# Patient Record
Sex: Male | Born: 1995
Health system: Southern US, Community
[De-identification: ages and names within clinical notes are randomized; demographics above are authoritative.]

## PROBLEM LIST (undated history)

## (undated) HISTORY — PX: ADENOIDECTOMY: SUR15

---
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
## 2018-05-03 ENCOUNTER — Telehealth: Payer: Self-pay | Admitting: Family Medicine

## 2018-05-03 MED ORDER — AZELASTINE HCL 0.1 % NA SOLN: NASAL | 5 refills | Status: DC

## 2018-05-03 NOTE — Telephone Encounter (Signed)
Pt's mom calling to see if a nasal spray can be called in that he can use with his allergy medication. 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   [facility] - [place],  - [address]

## 2018-05-03 NOTE — Telephone Encounter (Signed)
I would recommend allergy nasal spray I recommend using Astelin and typically does a good job without increasing the risk of viruses Flonase can also be used but it can cause increased risk of acquiring viruses including coronavirus Astelin 2 sprays each nostril twice daily, 1 spray bottle, 5 refills  Also recommend taking OTC allergy tablet store brand of Claritin or Allegra 1 daily

## 2018-05-03 NOTE — Telephone Encounter (Signed)
Medication sent in and pt mom is aware. Pt mom states that patient is taking Claritin daily

## 2019-06-04 DIAGNOSIS — D234 Other benign neoplasm of skin of scalp and neck: Secondary | ICD-10-CM | POA: Diagnosis not present

## 2019-06-04 DIAGNOSIS — B360 Pityriasis versicolor: Secondary | ICD-10-CM | POA: Diagnosis not present

## 2019-08-12 ENCOUNTER — Encounter (HOSPITAL_COMMUNITY): Payer: Self-pay | Admitting: Emergency Medicine

## 2019-08-12 ENCOUNTER — Other Ambulatory Visit: Payer: Self-pay

## 2019-08-12 ENCOUNTER — Emergency Department (HOSPITAL_COMMUNITY)
Admission: EM | Admit: 2019-08-12 | Discharge: 2019-08-12 | Disposition: A | Discharge status: Home / Self Care | Payer: BLUE CROSS/BLUE SHIELD | Attending: Emergency Medicine | Admitting: Emergency Medicine

## 2019-08-12 DIAGNOSIS — R21 Rash and other nonspecific skin eruption: Secondary | ICD-10-CM | POA: Insufficient documentation

## 2019-08-12 DIAGNOSIS — Z5321 Procedure and treatment not carried out due to patient leaving prior to being seen by health care provider: Secondary | ICD-10-CM | POA: Insufficient documentation

## 2019-08-12 NOTE — ED Triage Notes (Signed)
Pt c/o rash to bilateral upper arms and upper chest that started at 1800 tonight. Pt states he took 50mg  of Benadryl about 3 hours ago, rash looks better now according to pt.

## 2019-08-16 ENCOUNTER — Other Ambulatory Visit: Payer: Self-pay

## 2019-08-16 ENCOUNTER — Ambulatory Visit
Admission: EM | Admit: 2019-08-16 | Discharge: 2019-08-16 | Disposition: A | Discharge status: Home / Self Care | Payer: Federal, State, Local not specified - PPO | Attending: Emergency Medicine | Admitting: Emergency Medicine

## 2019-08-16 ENCOUNTER — Encounter: Payer: Self-pay | Admitting: Emergency Medicine

## 2019-08-16 DIAGNOSIS — L239 Allergic contact dermatitis, unspecified cause: Secondary | ICD-10-CM | POA: Diagnosis not present

## 2019-08-16 MED ORDER — METHYLPREDNISOLONE SODIUM SUCC 125 MG IJ SOLR
80.0000 mg | Freq: Once | INTRAMUSCULAR | Status: AC
  Administered 2019-08-16: 80 mg via INTRAMUSCULAR

## 2019-08-16 MED ORDER — PREDNISONE 10 MG (21) PO TBPK: ORAL_TABLET | ORAL | 0 refills | Status: DC

## 2019-08-16 NOTE — Discharge Instructions (Addendum)
Prescribed prednisone taper Continue to take Benadryl as prescribed Take as prescribed and to completion.   Moisturize skin daily Follow up with PCP if symptoms persists Return or go to the ER if you have any new or worsening symptoms

## 2019-08-16 NOTE — ED Triage Notes (Signed)
Pt has had an itchy rash on and off since Sunday to bilateral wrist and running up arms.  Also had the rash this morning on his feet but has went away now.

## 2019-08-16 NOTE — ED Provider Notes (Addendum)
[facility]   [id number] 08/16/19 Arrival Time: 1840   Chief Complaint  Patient presents with  . Rash    SUBJECTIVE: History from: patient.  Jeremy Webster is a 24 y.o. male who presented to the urgent care with a complaint of rash for the past 4 days.  He denies changes in soaps, detergents, or anyone with similar symptoms.  He localizes the rash to his hand and feet.  He describes it as red, itchy and spreading.  Has tried OTC Benadryl without relief.  Denies similar symptoms in the past.  Denies chills, fever, nausea, vomiting, diarrhea.  ROS: As per HPI.  All other pertinent ROS negative.      History reviewed. No pertinent past medical history. Past Surgical History:  Procedure Laterality Date  . ADENOIDECTOMY    . TONSILLECTOMY AND ADENOIDECTOMY  08/27/04   Allergies  Allergen Reactions  . Amoxil [Amoxicillin]   . Bactrim [Sulfamethoxazole-Trimethoprim]   . Cefzil [Cefprozil]    No current facility-administered medications on file prior to encounter.   Current Outpatient Medications on File Prior to Encounter  Medication Sig Dispense Refill  . azelastine (ASTELIN) 0.1 % nasal spray 2 sprays into each nostril twice daily 30 mL 5  . azithromycin (ZITHROMAX Z-PAK) 250 MG tablet Take 2 tablets (500 mg) on  Day 1,  followed by 1 tablet (250 mg) once daily on Days 2 through 5. 6 each 0  . ranitidine (ZANTAC) 300 MG tablet Take 1 tablet (300 mg total) by mouth at bedtime. 30 tablet 5   Social History   Socioeconomic History  . Marital status: Single    Spouse name: Not on file  . Number of children: Not on file  . Years of education: Not on file  . Highest education level: Not on file  Occupational History  . Not on file  Tobacco Use  . Smoking status: Never Smoker  . Smokeless tobacco: Current User    Types: Chew  Substance and Sexual Activity  . Alcohol use: Yes    Comment: occ  . Drug use: No  . Sexual activity: Not on file  Other Topics Concern  .  Not on file  Social History Narrative  . Not on file   Social Determinants of Health   Financial Resource Strain:   . Difficulty of Paying Living Expenses:   Food Insecurity:   . Worried About Programme researcher, broadcasting/film/video in the Last Year:   . Barista in the Last Year:   Transportation Needs:   . Freight forwarder (Medical):   Marland Kitchen Lack of Transportation (Non-Medical):   Physical Activity:   . Days of Exercise per Week:   . Minutes of Exercise per Session:   Stress:   . Feeling of Stress :   Social Connections:   . Frequency of Communication with Friends and Family:   . Frequency of Social Gatherings with Friends and Family:   . Attends Religious Services:   . Active Member of Clubs or Organizations:   . Attends Banker Meetings:   Marland Kitchen Marital Status:   Intimate Partner Violence:   . Fear of Current or Ex-Partner:   . Emotionally Abused:   Marland Kitchen Physically Abused:   . Sexually Abused:    No family history on file.  OBJECTIVE:  Vitals:   08/16/19 1849 08/16/19 1850  BP:  112/71  Pulse:  87  Resp:  18  Temp:  98.4 F (36.9 C)  TempSrc:  Oral  SpO2:  98%  Weight: (!) 240 lb (108.9 kg)   Height: 6\' 2"  (1.88 m)      Physical Exam Vitals and nursing note reviewed.  Constitutional:      General: He is not in acute distress.    Appearance: Normal appearance. He is normal weight. He is not ill-appearing, toxic-appearing or diaphoretic.  Cardiovascular:     Rate and Rhythm: Normal rate and regular rhythm.     Pulses: Normal pulses.     Heart sounds: Normal heart sounds. No murmur heard.  No friction rub. No gallop.   Pulmonary:     Effort: Pulmonary effort is normal. No respiratory distress.     Breath sounds: Normal breath sounds. No stridor. No wheezing, rhonchi or rales.  Chest:     Chest wall: No tenderness.  Skin:    General: Skin is warm.     Findings: Rash present. Rash is macular.  Neurological:     Mental Status: He is alert and oriented to  person, place, and time.     LABS:  No results found for this or any previous visit (from the past 24 hour(s)).   ASSESSMENT & PLAN:  1. Allergic dermatitis     Meds ordered this encounter  Medications  . methylPREDNISolone sodium succinate (SOLU-MEDROL) 125 mg/2 mL injection 80 mg  . predniSONE (STERAPRED UNI-PAK 21 TAB) 10 MG (21) TBPK tablet    Sig: Take 6 tabs by mouth daily  for 1 days, then 5 tabs for 1 days, then 4 tabs for 1 days, then 3 tabs for 1 days, 2 tabs for 1 days, then 1 tab by mouth daily for 1 days    Dispense:  21 tablet    Refill:  0   Discharge Instructions Prescribed prednisone taper Continue to take Benadryl as prescribed Take as prescribed and to completion.   Moisturize skin daily Follow up with PCP if symptoms persists Return or go to the ER if you have any new or worsening symptoms  Reviewed expectations re: course of current medical issues. Questions answered. Outlined signs and symptoms indicating need for more acute intervention. Patient verbalized understanding. After Visit Summary given.        Note: This document was prepared using Dragon voice recognition software and may include unintentional dictation errors.     , FNP 08/16/19 1918

## 2019-12-11 ENCOUNTER — Ambulatory Visit (INDEPENDENT_AMBULATORY_CARE_PROVIDER_SITE_OTHER): Payer: Federal, State, Local not specified - PPO | Admitting: Family Medicine

## 2019-12-11 ENCOUNTER — Encounter: Payer: Self-pay | Admitting: Family Medicine

## 2019-12-11 ENCOUNTER — Other Ambulatory Visit: Payer: Self-pay

## 2019-12-11 VITALS — BP 116/72 | HR 88 | Temp 97.8°F | Ht 74.0 in | Wt 243.4 lb

## 2019-12-11 DIAGNOSIS — R197 Diarrhea, unspecified: Secondary | ICD-10-CM

## 2019-12-11 MED ORDER — ONDANSETRON 4 MG PO TBDP: 4.0000 mg | ORAL_TABLET | Freq: Three times a day (TID) | ORAL | 0 refills | Status: DC | PRN

## 2019-12-11 MED ORDER — DICYCLOMINE HCL 10 MG PO CAPS: 10.0000 mg | ORAL_CAPSULE | Freq: Three times a day (TID) | ORAL | 0 refills | Status: DC

## 2019-12-11 NOTE — Progress Notes (Signed)
Patient ID: Jeremy Webster, male    DOB: 1995-03-01, 24 y.o.   MRN: [id number]   Chief Complaint  Patient presents with  . Abdominal Pain   Subjective:    HPI   Pt has been having stomach cramps and loose stool since Friday. No mucus or blood in stool. Has had some nausea. Has taken Pepto and anti diarrheal med.   3 days ago had upset stomach.  Having loose stool and diarrhea for past 3 days. Lasting till today and has dec today.  Has had 3 episodes earllier today. Not tried any pepto or immodium today. Took some meds otc over the weekend, helped slightly. Went 10 x on Friday-sun. No other sick contacts. No blood in stool.  No pian in abd.  Just having intermittent cramping. Small amt nausea. Eating/drinking.  Dec appetite since Friday. At nutrigrain bar and beans/hot dog- today.  Pt has been having stomach cramps and loose stool since Friday. No mucus or blood in stool. Has had some nausea. Has taken Pepto and anti diarrheal med.  Medical History Lottie has no past medical history on file.   Outpatient Encounter Medications as of 12/11/2019  Medication Sig  . azelastine (ASTELIN) 0.1 % nasal spray 2 sprays into each nostril twice daily  . dicyclomine (BENTYL) 10 MG capsule Take 1 capsule (10 mg total) by mouth 4 (four) times daily -  before meals and at bedtime.  . ondansetron (ZOFRAN-ODT) 4 MG disintegrating tablet Take 1 tablet (4 mg total) by mouth every 8 (eight) hours as needed for nausea or vomiting.  . [DISCONTINUED] azithromycin (ZITHROMAX Z-PAK) 250 MG tablet Take 2 tablets (500 mg) on  Day 1,  followed by 1 tablet (250 mg) once daily on Days 2 through 5.  . [DISCONTINUED] predniSONE (STERAPRED UNI-PAK 21 TAB) 10 MG (21) TBPK tablet Take 6 tabs by mouth daily  for 1 days, then 5 tabs for 1 days, then 4 tabs for 1 days, then 3 tabs for 1 days, 2 tabs for 1 days, then 1 tab by mouth daily for 1 days  . [DISCONTINUED] ranitidine (ZANTAC) 300 MG tablet Take 1 tablet (300  mg total) by mouth at bedtime.   No facility-administered encounter medications on file as of 12/11/2019.     Review of Systems  Constitutional: Negative for chills and fever.  HENT: Negative for congestion, rhinorrhea and sore throat.   Respiratory: Negative for cough, shortness of breath and wheezing.   Cardiovascular: Negative for chest pain and leg swelling.  Gastrointestinal: Positive for abdominal pain (cramping, intermittent) and diarrhea. Negative for nausea and vomiting.  Genitourinary: Negative for dysuria and frequency.  Skin: Negative for rash.  Neurological: Negative for dizziness, weakness and headaches.     Vitals BP 116/72   Pulse 88   Temp 97.8 F (36.6 C)   Ht 6\' 2"  (1.88 m)   Wt 243 lb 6.4 oz (110.4 kg)   SpO2 98%   BMI 31.25 kg/m   Objective:   Physical Exam Vitals and nursing note reviewed.  Constitutional:      General: He is not in acute distress.    Appearance: Normal appearance. He is not ill-appearing.  HENT:     Head: Normocephalic.     Nose: Nose normal. No congestion.     Mouth/Throat:     Mouth: Mucous membranes are moist.     Pharynx: No oropharyngeal exudate.  Eyes:     Extraocular Movements: Extraocular movements intact.  Conjunctiva/sclera: Conjunctivae normal.     Pupils: Pupils are equal, round, and reactive to light.  Cardiovascular:     Rate and Rhythm: Normal rate and regular rhythm.     Pulses: Normal pulses.     Heart sounds: Normal heart sounds. No murmur heard.   Pulmonary:     Effort: Pulmonary effort is normal.     Breath sounds: Normal breath sounds. No wheezing, rhonchi or rales.  Abdominal:     General: Abdomen is flat. Bowel sounds are normal. There is no distension.     Palpations: Abdomen is soft.     Tenderness: There is no abdominal tenderness. There is no guarding or rebound. Negative signs include Murphy's sign and McBurney's sign.  Musculoskeletal:        General: Normal range of motion.     Right  lower leg: No edema.     Left lower leg: No edema.  Skin:    General: Skin is warm and dry.     Findings: No rash.  Neurological:     General: No focal deficit present.     Mental Status: He is alert and oriented to person, place, and time.     Cranial Nerves: No cranial nerve deficit.  Psychiatric:        Mood and Affect: Mood normal.        Behavior: Behavior normal.        Thought Content: Thought content normal.        Judgment: Judgment normal.      Assessment and Plan   1. Diarrhea, unspecified type - ondansetron (ZOFRAN-ODT) 4 MG disintegrating tablet; Take 1 tablet (4 mg total) by mouth every 8 (eight) hours as needed for nausea or vomiting.  Dispense: 20 tablet; Refill: 0 - dicyclomine (BENTYL) 10 MG capsule; Take 1 capsule (10 mg total) by mouth 4 (four) times daily -  before meals and at bedtime.  Dispense: 20 capsule; Refill: 0    Likely viral acute gastroenteritis, improving.  Symptomatic tx. and increase fluids and eat brat diet.  prescribed bentyl or zofran prn for nausea and cramping. Can take immodium or pepto as needed. Cont to increase fluids. Pt to call in next 2-3 days if not improving or worsening with pain, fever, or blood in stool.  F/u prn.

## 2019-12-22 ENCOUNTER — Encounter: Payer: Self-pay | Admitting: Family Medicine

## 2020-05-30 DIAGNOSIS — R1312 Dysphagia, oropharyngeal phase: Secondary | ICD-10-CM | POA: Diagnosis not present

## 2020-05-31 ENCOUNTER — Other Ambulatory Visit (HOSPITAL_COMMUNITY): Payer: Self-pay | Admitting: Otolaryngology

## 2020-05-31 DIAGNOSIS — R1310 Dysphagia, unspecified: Secondary | ICD-10-CM

## 2020-06-11 ENCOUNTER — Ambulatory Visit (HOSPITAL_COMMUNITY)
Admission: RE | Admit: 2020-06-11 | Discharge: 2020-06-11 | Disposition: A | Discharge status: Home / Self Care | Payer: Federal, State, Local not specified - PPO | Source: Ambulatory Visit | Attending: Otolaryngology | Admitting: Otolaryngology

## 2020-06-11 DIAGNOSIS — R1310 Dysphagia, unspecified: Secondary | ICD-10-CM

## 2021-07-02 ENCOUNTER — Encounter: Payer: Self-pay | Admitting: Emergency Medicine

## 2021-07-02 ENCOUNTER — Ambulatory Visit
Admission: EM | Admit: 2021-07-02 | Discharge: 2021-07-02 | Disposition: A | Discharge status: Home / Self Care | Payer: Federal, State, Local not specified - PPO | Attending: Family Medicine | Admitting: Family Medicine

## 2021-07-02 ENCOUNTER — Other Ambulatory Visit: Payer: Self-pay

## 2021-07-02 DIAGNOSIS — R21 Rash and other nonspecific skin eruption: Secondary | ICD-10-CM | POA: Diagnosis not present

## 2021-07-02 MED ORDER — PREDNISONE 10 MG PO TABS: ORAL_TABLET | ORAL | 0 refills | Status: DC

## 2021-07-02 MED ORDER — TRIAMCINOLONE ACETONIDE 0.1 % EX CREA: 1.0000 "application " | TOPICAL_CREAM | Freq: Two times a day (BID) | CUTANEOUS | 0 refills | Status: DC

## 2021-07-02 NOTE — ED Provider Notes (Signed)
[facility]    CSN: [id number] Arrival date & time: 07/02/21  0806      History   Chief Complaint Chief Complaint  Patient presents with   Rash    HPI CHARES Webster is a 26 y.o. male.   Patient presenting today with an itchy rash that started on his upper extremities yesterday evening and is now across most of his body in patches.  He states it is itchy but not painful and denies any throat itching or swelling, chest tightness, difficulty breathing, nausea, vomiting.  No new foods, medications, outdoor exposures though has been working outside often lately.  Has not tried any medication so far for symptoms.    History reviewed. No pertinent past medical history.  Patient Active Problem List   Diagnosis Date Noted   Pharyngeal dysphagia 10/07/2016   Gastroesophageal reflux disease without esophagitis 10/07/2016   Tobacco chew use 10/07/2016    Past Surgical History:  Procedure Laterality Date   ADENOIDECTOMY     TONSILLECTOMY AND ADENOIDECTOMY  08/27/04       Home Medications    Prior to Admission medications   Medication Sig Start Date End Date Taking? Authorizing Provider  predniSONE (DELTASONE) 10 MG tablet Take 6 tabs daily x 2 days, 5 tabs daily x 2 days, 4 tabs daily x 2 days, etc 07/02/21  Yes Particia Nearing, PA-C  triamcinolone cream (KENALOG) 0.1 % Apply 1 application. topically 2 (two) times daily. 07/02/21  Yes Particia Nearing, PA-C  azelastine (ASTELIN) 0.1 % nasal spray 2 sprays into each nostril twice daily 05/03/18   Babs Sciara, MD  dicyclomine (BENTYL) 10 MG capsule Take 1 capsule (10 mg total) by mouth 4 (four) times daily -  before meals and at bedtime. 12/11/19   Ladona Ridgel, Malena M, DO  ondansetron (ZOFRAN-ODT) 4 MG disintegrating tablet Take 1 tablet (4 mg total) by mouth every 8 (eight) hours as needed for nausea or vomiting. 12/11/19   Annalee Genta, DO    Family History History reviewed. No pertinent family  history.  Social History Social History   Tobacco Use   Smoking status: Never   Smokeless tobacco: Current    Types: Chew  Substance Use Topics   Alcohol use: Yes    Comment: occ   Drug use: No     Allergies   Amoxil [amoxicillin], Bactrim [sulfamethoxazole-trimethoprim], and Cefzil [cefprozil]   Review of Systems Review of Systems Per HPI  Physical Exam Triage Vital Signs ED Triage Vitals  Enc Vitals Group     BP 07/02/21 0817 111/77     Pulse Rate 07/02/21 0817 86     Resp 07/02/21 0817 18     Temp 07/02/21 0817 97.8 F (36.6 C)     Temp Source 07/02/21 0817 Oral     SpO2 07/02/21 0817 98 %     Weight 07/02/21 0818 240 lb (108.9 kg)     Height 07/02/21 0818 6\' 2"  (1.88 m)     Head Circumference --      Peak Flow --      Pain Score 07/02/21 0818 0     Pain Loc --      Pain Edu? --      Excl. in GC? --    No data found.  Updated Vital Signs BP 111/77 (BP Location: Right Arm)   Pulse 86   Temp 97.8 F (36.6 C) (Oral)   Resp 18   Ht 6\' 2"  (1.88  m)   Wt 240 lb (108.9 kg)   SpO2 98%   BMI 30.81 kg/m   Visual Acuity Right Eye Distance:   Left Eye Distance:   Bilateral Distance:    Right Eye Near:   Left Eye Near:    Bilateral Near:     Physical Exam Vitals and nursing note reviewed.  Constitutional:      Appearance: Normal appearance.  HENT:     Head: Atraumatic.     Mouth/Throat:     Mouth: Mucous membranes are moist.     Pharynx: Oropharynx is clear.  Eyes:     Extraocular Movements: Extraocular movements intact.     Conjunctiva/sclera: Conjunctivae normal.  Cardiovascular:     Rate and Rhythm: Normal rate and regular rhythm.  Pulmonary:     Effort: Pulmonary effort is normal.     Breath sounds: Normal breath sounds. No wheezing or rales.  Musculoskeletal:        General: Normal range of motion.     Cervical back: Normal range of motion and neck supple.  Skin:    General: Skin is warm and dry.     Findings: Rash present.      Comments: Sporadic erythematous maculopapular rash in areas across body sparing the face  Neurological:     General: No focal deficit present.     Mental Status: He is oriented to person, place, and time.  Psychiatric:        Mood and Affect: Mood normal.        Thought Content: Thought content normal.        Judgment: Judgment normal.      UC Treatments / Results  Labs (all labs ordered are listed, but only abnormal results are displayed) Labs Reviewed - No data to display  EKG   Radiology No results found.  Procedures Procedures (including critical care time)  Medications Ordered in UC Medications - No data to display  Initial Impression / Assessment and Plan / UC Course  I have reviewed the triage vital signs and the nursing notes.  Pertinent labs & imaging results that were available during my care of the patient were reviewed by me and considered in my medical decision making (see chart for details).     Suspect poison ivy dermatitis, treat with extended prednisone taper, triamcinolone cream, antihistamines.  Return for worsening symptoms.  Final Clinical Impressions(s) / UC Diagnoses   Final diagnoses:  Rash   Discharge Instructions   None    ED Prescriptions     Medication Sig Dispense Auth. Provider   predniSONE (DELTASONE) 10 MG tablet Take 6 tabs daily x 2 days, 5 tabs daily x 2 days, 4 tabs daily x 2 days, etc 42 tablet Particia Nearing, PA-C   triamcinolone cream (KENALOG) 0.1 % Apply 1 application. topically 2 (two) times daily. 60 g Particia Nearing, [place]      PDMP not reviewed this encounter.   Roosvelt Maser [place], [place] 07/02/21 [phone]

## 2021-07-02 NOTE — ED Triage Notes (Signed)
Pt reports generalized rash that started last night. Pt reports started on bilateral hands with swelling and reports this morning it has "spread all over my body." Pt reports generalized itching sensation. Airway patent. Denies any new self care products.

## 2021-07-03 ENCOUNTER — Telehealth: Payer: Self-pay | Admitting: Family Medicine

## 2021-07-03 NOTE — Telephone Encounter (Signed)
Pt contacted and verbalized understanding.  

## 2021-07-03 NOTE — Telephone Encounter (Signed)
Pt sister calling and upset due to being directed to Urgent Care/ED due to no available slots. Pt went to urgent care yesterday for a rash. Sister states that the urgent care doctor was unsure of what it was. Pt has bumps from head to feet and has spread. Please advise. Thank you.  [facility]

## 2021-12-20 ENCOUNTER — Ambulatory Visit
Admission: EM | Admit: 2021-12-20 | Discharge: 2021-12-20 | Disposition: A | Discharge status: Home / Self Care | Payer: Federal, State, Local not specified - PPO | Attending: Nurse Practitioner | Admitting: Nurse Practitioner

## 2021-12-20 DIAGNOSIS — Z1152 Encounter for screening for COVID-19: Secondary | ICD-10-CM | POA: Insufficient documentation

## 2021-12-20 DIAGNOSIS — B349 Viral infection, unspecified: Secondary | ICD-10-CM | POA: Diagnosis not present

## 2021-12-20 LAB — RESP PANEL BY RT-PCR (FLU A&B, COVID) ARPGX2
Influenza A by PCR: NEGATIVE
Influenza B by PCR: NEGATIVE
SARS Coronavirus 2 by RT PCR: NEGATIVE

## 2021-12-20 NOTE — ED Triage Notes (Signed)
Pt reports cough, nasal congestion, body aches and chills x 3 days. Mucinex gives no relief.

## 2021-12-20 NOTE — Discharge Instructions (Addendum)
COVID/flu test is pending.  If your pending test results are positive, you will be contacted.  You will also be able to access your pending test results using your MyChart account.  As discussed, if the COVID test is positive, you are a candidate to receive molnupiravir as an antiviral therapy. Increase fluids and allow for plenty of rest. Recommend continuing ibuprofen or Tylenol as needed for pain, fever, or generalized discomfort. Recommend remaining in isolation until you have received your COVID results. Please be advised a viral infection can last anywhere from 7 to 14 days.  If your symptoms suddenly worsen to include high fever, cough that causes you to be short of breath or have difficulty breathing, or other concerns, please follow-up in this clinic or with your primary care physician. Follow-up as needed.

## 2021-12-20 NOTE — ED Provider Notes (Signed)
[facility]    CSN: [id number] Arrival date & time: 12/20/21  0902      History   Chief Complaint Chief Complaint  Patient presents with   Generalized Body Aches   Cough    HPI Jeremy Webster is a 26 y.o. male.   The history is provided by the patient.   Patient presents with a 2 to 3-day history of bodyaches, headaches, chills, and generalized fatigue.  Denies fever, sore throat, nasal congestion, runny nose, cough, abdominal pain, nausea, vomiting, or diarrhea.  Patient states he has been taking over-the-counter Mucinex cough and cold for his symptoms.  He states that he has also been taking ibuprofen for his headache.  He denies any known sick contacts.  States he has not been vaccinated for COVID.   History reviewed. No pertinent past medical history.  Patient Active Problem List   Diagnosis Date Noted   Pharyngeal dysphagia 10/07/2016   Gastroesophageal reflux disease without esophagitis 10/07/2016   Tobacco chew use 10/07/2016    Past Surgical History:  Procedure Laterality Date   ADENOIDECTOMY     TONSILLECTOMY AND ADENOIDECTOMY  08/27/04       Home Medications    Prior to Admission medications   Medication Sig Start Date End Date Taking? Authorizing Provider  dextromethorphan-guaiFENesin (MUCINEX DM) 30-600 MG 12hr tablet Take 1 tablet by mouth 2 (two) times daily.   Yes [provider]  azelastine (ASTELIN) 0.1 % nasal spray 2 sprays into each nostril twice daily 05/03/18   Luking, Jonna Coup, MD  dicyclomine (BENTYL) 10 MG capsule Take 1 capsule (10 mg total) by mouth 4 (four) times daily -  before meals and at bedtime. 12/11/19   Ladona Ridgel, Malena M, DO  ondansetron (ZOFRAN-ODT) 4 MG disintegrating tablet Take 1 tablet (4 mg total) by mouth every 8 (eight) hours as needed for nausea or vomiting. 12/11/19   Laroy Apple M, DO  predniSONE (DELTASONE) 10 MG tablet Take 6 tabs daily x 2 days, 5 tabs daily x 2 days, 4 tabs daily x 2 days, etc  07/02/21   Particia Nearing, PA-C  triamcinolone cream (KENALOG) 0.1 % Apply 1 application. topically 2 (two) times daily. 07/02/21   Particia Nearing, PA-C    Family History History reviewed. No pertinent family history.  Social History Social History   Tobacco Use   Smoking status: Never   Smokeless tobacco: Current    Types: Chew  Vaping Use   Vaping Use: Never used  Substance Use Topics   Alcohol use: Yes    Comment: occ   Drug use: No     Allergies   Amoxil [amoxicillin], Bactrim [sulfamethoxazole-trimethoprim], and Cefzil [cefprozil]   Review of Systems Review of Systems Per HPI  Physical Exam Triage Vital Signs ED Triage Vitals  Enc Vitals Group     BP 12/20/21 1025 116/68     Pulse Rate 12/20/21 1025 (!) 103     Resp 12/20/21 1025 16     Temp 12/20/21 1025 99 F (37.2 C)     Temp Source 12/20/21 1025 Oral     SpO2 12/20/21 1025 98 %     Weight --      Height --      Head Circumference --      Peak Flow --      Pain Score 12/20/21 1023 0     Pain Loc --      Pain Edu? --  Excl. in GC? --    No data found.  Updated Vital Signs BP 116/68 (BP Location: Right Arm)   Pulse (!) 103   Temp 99 F (37.2 C) (Oral)   Resp 16   SpO2 98%   Visual Acuity Right Eye Distance:   Left Eye Distance:   Bilateral Distance:    Right Eye Near:   Left Eye Near:    Bilateral Near:     Physical Exam Vitals and nursing note reviewed.  Constitutional:      General: He is not in acute distress.    Appearance: Normal appearance.  HENT:     Head: Normocephalic.     Right Ear: Tympanic membrane, ear canal and external ear normal.     Left Ear: Tympanic membrane, ear canal and external ear normal.     Nose: Congestion present.     Right Turbinates: Enlarged and swollen.     Left Turbinates: Enlarged and swollen.     Right Sinus: No maxillary sinus tenderness or frontal sinus tenderness.     Left Sinus: No maxillary sinus tenderness or frontal sinus  tenderness.     Mouth/Throat:     Lips: Pink.     Mouth: Mucous membranes are moist.     Pharynx: Uvula midline. Pharyngeal swelling and posterior oropharyngeal erythema present. No oropharyngeal exudate or uvula swelling.     Tonsils: 1+ on the right. 1+ on the left.  Eyes:     Extraocular Movements: Extraocular movements intact.     Conjunctiva/sclera: Conjunctivae normal.     Pupils: Pupils are equal, round, and reactive to light.  Cardiovascular:     Rate and Rhythm: Normal rate and regular rhythm.     Pulses: Normal pulses.     Heart sounds: Normal heart sounds.  Pulmonary:     Effort: Pulmonary effort is normal. No respiratory distress.     Breath sounds: Normal breath sounds. No stridor. No wheezing, rhonchi or rales.  Abdominal:     General: Bowel sounds are normal.     Palpations: Abdomen is soft.     Tenderness: There is no abdominal tenderness.  Musculoskeletal:     Cervical back: Normal range of motion.  Lymphadenopathy:     Cervical: No cervical adenopathy.  Skin:    General: Skin is warm and dry.  Neurological:     General: No focal deficit present.     Mental Status: He is alert and oriented to person, place, and time.  Psychiatric:        Mood and Affect: Mood normal.        Behavior: Behavior normal.      UC Treatments / Results  Labs (all labs ordered are listed, but only abnormal results are displayed) Labs Reviewed  RESP PANEL BY RT-PCR (FLU A&B, COVID) [id number]    EKG   Radiology No results found.  Procedures Procedures (including critical care time)  Medications Ordered in UC Medications - No data to display  Initial Impression / Assessment and Plan / UC Course  I have reviewed the triage vital signs and the nursing notes.  Pertinent labs & imaging results that were available during my care of the patient were reviewed by me and considered in my medical decision making (see chart for details).  Patient presents with bodyaches, headache,  and fatigue that been present for the past 3 days.  On exam, his vital signs are stable, although he is slightly tachycardic, he is well-appearing, and is in  no acute distress.  Symptoms are consistent with a viral illness at this time.  COVID/flu test is pending.  Patient would like to receive antiviral therapy if the COVID test is positive.  He is a candidate to receive molnupiravir.  Discussed remaining in isolation until his results are received.  If patient's COVID test is positive, advised him we will be able to extend his work note for an additional 2 days.  He will need to follow-up with his PCP if he requires more time out of work.  Discussed viral etiology with the patient and how long symptoms may last.  Supportive care recommendations were provided to the patient.  Patient verbalizes understanding.  All questions were answered.  Patient is stable for discharge. Final Clinical Impressions(s) / UC Diagnoses   Final diagnoses:  Viral illness     Discharge Instructions      COVID/flu test is pending.  If your pending test results are positive, you will be contacted.  You will also be able to access your pending test results using your MyChart account.  As discussed, if the COVID test is positive, you are a candidate to receive molnupiravir as an antiviral therapy. Increase fluids and allow for plenty of rest. Recommend continuing ibuprofen or Tylenol as needed for pain, fever, or generalized discomfort. Recommend remaining in isolation until you have received your COVID results. Please be advised a viral infection can last anywhere from 7 to 14 days.  If your symptoms suddenly worsen to include high fever, cough that causes you to be short of breath or have difficulty breathing, or other concerns, please follow-up in this clinic or with your primary care physician. Follow-up as needed.     ED Prescriptions   None    PDMP not reviewed this encounter.   Abran Cantor,  NP 12/20/21 1053

## 2023-10-06 ENCOUNTER — Emergency Department (HOSPITAL_COMMUNITY)
Admission: EM | Admit: 2023-10-06 | Discharge: 2023-10-06 | Disposition: A | Discharge status: Home / Self Care | Attending: Emergency Medicine | Admitting: Emergency Medicine

## 2023-10-06 ENCOUNTER — Other Ambulatory Visit: Payer: Self-pay

## 2023-10-06 DIAGNOSIS — R202 Paresthesia of skin: Secondary | ICD-10-CM | POA: Insufficient documentation

## 2023-10-06 DIAGNOSIS — R42 Dizziness and giddiness: Secondary | ICD-10-CM | POA: Diagnosis not present

## 2023-10-06 DIAGNOSIS — R200 Anesthesia of skin: Secondary | ICD-10-CM | POA: Diagnosis not present

## 2023-10-06 LAB — CBC WITH DIFFERENTIAL/PLATELET
Abs Immature Granulocytes: 0.03 K/uL (ref 0.00–0.07)
Basophils Absolute: 0 K/uL (ref 0.0–0.1)
Basophils Relative: 0 %
Eosinophils Absolute: 0.1 K/uL (ref 0.0–0.5)
Eosinophils Relative: 2 %
HCT: 52.2 % — ABNORMAL HIGH (ref 39.0–52.0)
Hemoglobin: 16.9 g/dL (ref 13.0–17.0)
Immature Granulocytes: 1 %
Lymphocytes Relative: 25 %
Lymphs Abs: 1.5 K/uL (ref 0.7–4.0)
MCH: 27.8 pg (ref 26.0–34.0)
MCHC: 32.4 g/dL (ref 30.0–36.0)
MCV: 85.9 fL (ref 80.0–100.0)
Monocytes Absolute: 0.5 K/uL (ref 0.1–1.0)
Monocytes Relative: 8 %
Neutro Abs: 3.8 K/uL (ref 1.7–7.7)
Neutrophils Relative %: 64 %
Platelets: 354 K/uL (ref 150–400)
RBC: 6.08 MIL/uL — ABNORMAL HIGH (ref 4.22–5.81)
RDW: 12.5 % (ref 11.5–15.5)
WBC: 6 K/uL (ref 4.0–10.5)
nRBC: 0 % (ref 0.0–0.2)

## 2023-10-06 LAB — COMPREHENSIVE METABOLIC PANEL WITH GFR
ALT: 25 U/L (ref 0–44)
AST: 21 U/L (ref 15–41)
Albumin: 4.4 g/dL (ref 3.5–5.0)
Alkaline Phosphatase: 63 U/L (ref 38–126)
Anion gap: 11 (ref 5–15)
BUN: 10 mg/dL (ref 6–20)
CO2: 22 mmol/L (ref 22–32)
Calcium: 9.7 mg/dL (ref 8.9–10.3)
Chloride: 104 mmol/L (ref 98–111)
Creatinine, Ser: 0.95 mg/dL (ref 0.61–1.24)
GFR, Estimated: >60 mL/min (ref >60)
Glucose, Bld: 117 mg/dL — ABNORMAL HIGH (ref 70–99)
Potassium: 4.4 mmol/L (ref 3.5–5.1)
Sodium: 137 mmol/L (ref 135–145)
Total Bilirubin: 1.1 mg/dL (ref 0.0–1.2)
Total Protein: 7.7 g/dL (ref 6.5–8.1)

## 2023-10-06 LAB — I-STAT CHEM 8, ED
BUN: 12 mg/dL (ref 6–20)
Calcium, Ion: 1.18 mmol/L (ref 1.15–1.40)
Chloride: 103 mmol/L (ref 98–111)
Creatinine, Ser: 1 mg/dL (ref 0.61–1.24)
Glucose, Bld: 124 mg/dL — ABNORMAL HIGH (ref 70–99)
HCT: 52 % (ref 39.0–52.0)
Hemoglobin: 17.7 g/dL — ABNORMAL HIGH (ref 13.0–17.0)
Potassium: 4.5 mmol/L (ref 3.5–5.1)
Sodium: 139 mmol/L (ref 135–145)
TCO2: 24 mmol/L (ref 22–32)

## 2023-10-06 LAB — MAGNESIUM: Magnesium: 1.9 mg/dL (ref 1.7–2.4)

## 2023-10-06 MED ORDER — SODIUM CHLORIDE 0.9 % IV BOLUS
1000.0000 mL | Freq: Once | INTRAVENOUS | Status: AC
  Administered 2023-10-06: 1000 mL via INTRAVENOUS

## 2023-10-06 NOTE — Discharge Instructions (Signed)
 Return to the ER if you develop new or worsening numbness, weakness, headache, speech changes, or any other new/concerning symptoms.

## 2023-10-06 NOTE — ED Triage Notes (Addendum)
 Pt. Stated, I was on the way to work and my right arm felt tingling and went to left arm and to shoulders . When I stand up things were going around. Ive been sitting in my boss' office. Pt  was alert and oriented x 4 , No arm drift, = grips.

## 2023-10-06 NOTE — ED Provider Triage Note (Signed)
 Emergency Medicine Provider Triage Evaluation Note  Jeremy Webster , a 28 y.o. male  was evaluated in triage.  Pt complains of dizziness paresthesias in his arms.  Review of Systems  Positive: Dizziness and paresthesias in his arms Negative: No headache, chest pain shortness of breath weakness speech changes vision changes hearing loss.  Positive for lightheaded feeling tingling in his hands up his arms but getting better  Physical Exam  BP 136/83 (BP Location: Right Arm)   Pulse 84   Temp 97.6 F (36.4 C)   Resp 16   Ht 6' 2 (1.88 m)   Wt 111.1 kg   SpO2 100%   BMI 31.46 kg/m  Gen:   Awake, no distress   Resp:  Normal effort  MSK:   Moves extremities without difficulty  Other: Cranial nerves intact strength and sensation intact speech intact visual fields intact no nystagmus  Medical Decision Making  Medically screening exam initiated at 8:32 AM.  Appropriate orders placed.  Jeremy Webster was informed that the remainder of the evaluation will be completed by another provider, this initial triage assessment does not replace that evaluation, and the importance of remaining in the ED until their evaluation is complete.     Ruthe Cornet, DO 10/06/23 [phone]

## 2023-10-06 NOTE — ED Provider Notes (Signed)
 [place] EMERGENCY DEPARTMENT AT [facility] Provider Note   CSN: [id number] Arrival date & time: 10/06/23  9184     Patient presents with: Dizziness and Numbness   Jeremy Webster is a 28 y.o. male.   HPI 28 year old male with no significant past medical history presents with a chief complaint of numbness/tingling.  Symptoms started around 6:30 AM at work.  When he first woke up he felt fine.  He had some transient heartburn and abdominal pain yesterday but none today.  He started having right arm tingling in his entire arm that then moved to his left arm.  On the car ride here he had tingling in both legs.  Tingling in his arms is improved and now there is a little bit in his posterior neck.  However there is no neck pain, headache, vision changes, facial symptoms or chest pain.  Feels improved but does feel lightheaded, particularly when standing this morning.  Did not eat breakfast this morning but normally does not at this time of day.  Prior to Admission medications   Medication Sig Start Date End Date Taking? Authorizing Provider  azelastine  (ASTELIN ) 0.1 % nasal spray 2 sprays into each nostril twice daily 05/03/18   Luking, Glendia LABOR, MD  dextromethorphan-guaiFENesin (MUCINEX DM) 30-600 MG 12hr tablet Take 1 tablet by mouth 2 (two) times daily.    [provider]  dicyclomine  (BENTYL ) 10 MG capsule Take 1 capsule (10 mg total) by mouth 4 (four) times daily -  before meals and at bedtime. 12/11/19   Waddell, Malena M, DO  ondansetron  (ZOFRAN -ODT) 4 MG disintegrating tablet Take 1 tablet (4 mg total) by mouth every 8 (eight) hours as needed for nausea or vomiting. 12/11/19   Waddell Catholic M, DO  predniSONE  (DELTASONE ) 10 MG tablet Take 6 tabs daily x 2 days, 5 tabs daily x 2 days, 4 tabs daily x 2 days, etc 07/02/21   Stuart Vernell Norris, PA-C  triamcinolone  cream (KENALOG ) 0.1 % Apply 1 application. topically 2 (two) times daily. 07/02/21   Stuart Vernell Norris, PA-C     Allergies: Amoxil [amoxicillin], Bactrim [sulfamethoxazole-trimethoprim], and Cefzil [cefprozil]    Review of Systems  Respiratory:  Negative for shortness of breath.   Cardiovascular:  Negative for chest pain.  Gastrointestinal:  Negative for diarrhea and vomiting.  Musculoskeletal:  Negative for neck pain.  Neurological:  Positive for light-headedness and numbness. Negative for weakness and headaches.    Updated Vital Signs BP 136/83 (BP Location: Right Arm)   Pulse 84   Temp 97.6 F (36.4 C)   Resp 16   Ht 6' 2 (1.88 m)   Wt 111.1 kg   SpO2 100%   BMI 31.46 kg/m   Physical Exam Vitals and nursing note reviewed.  Constitutional:      General: He is not in acute distress.    Appearance: He is well-developed. He is not ill-appearing or diaphoretic.  HENT:     Head: Normocephalic and atraumatic.  Eyes:     Extraocular Movements: Extraocular movements intact.  Cardiovascular:     Rate and Rhythm: Normal rate and regular rhythm.     Pulses:          Radial pulses are 2+ on the right side and 2+ on the left side.     Heart sounds: Normal heart sounds.  Pulmonary:     Effort: Pulmonary effort is normal.     Breath sounds: Normal breath sounds.  Abdominal:  Palpations: Abdomen is soft.     Tenderness: There is no abdominal tenderness.  Skin:    General: Skin is warm and dry.  Neurological:     Mental Status: He is alert.     Comments: CN 3-12 grossly intact. 5/5 strength in all 4 extremities. Grossly normal sensation. Normal finger to nose.      (all labs ordered are listed, but only abnormal results are displayed) Labs Reviewed  CBC WITH DIFFERENTIAL/PLATELET - Abnormal; Notable for the following components:      Result Value   RBC 6.08 (*)    HCT 52.2 (*)    All other components within normal limits  COMPREHENSIVE METABOLIC PANEL WITH GFR - Abnormal; Notable for the following components:   Glucose, Bld 117 (*)    All other components within normal  limits  I-STAT CHEM 8, ED - Abnormal; Notable for the following components:   Glucose, Bld 124 (*)    Hemoglobin 17.7 (*)    All other components within normal limits  MAGNESIUM    EKG: EKG Interpretation Date/Time:  Thursday October 06 2023 08:27:44 EDT Ventricular Rate:  79 PR Interval:  170 QRS Duration:  94 QT Interval:  348 QTC Calculation: 399 R Axis:   55  Text Interpretation: Normal sinus rhythm with sinus arrhythmia Normal ECG No previous ECGs available Confirmed by Freddi Hamilton [phone]) on 10/06/2023 9:37:17 AM  Radiology: No results found.   Procedures   Medications Ordered in the ED  sodium chloride  0.9 % bolus 1,000 mL (has no administration in time range)                                    Medical Decision Making Amount and/or Complexity of Data Reviewed Labs:     Details: Unremarkable electrolytes ECG/medicine tests: ordered and independent interpretation performed.    Details: No ischemia   Patient presents with tingling.  Neurovascularly intact.  Exam is unremarkable including normal sensation.  I think these are paresthesias and given the diffuse nature think something such as stroke or spinal cord emergency is quite unlikely.  I do not think imaging is needed.  He has no chest pain.  Transient abdominal pain/GERD yesterday but has a benign abdominal exam and no complaints today.  Given some fluids and allowed to eat but with a negative workup I think he can follow-up with PCP and consider outpatient neuro follow-up if not better.  Patient states he drinks some alcohol but not significantly, perhaps can consider B12/folate testing if paresthesias continue as an outpatient.  Given return precautions.     Final diagnoses:  Paresthesia    ED Discharge Orders     None          Freddi Hamilton, MD 10/06/23 1304

## 2023-10-06 NOTE — ED Triage Notes (Signed)
 Pt c.o sudden onset of bilateral arm numbness and feeling lightheaded about 1 hour ago while driving to work. Denies any pain

## 2023-10-08 DIAGNOSIS — R079 Chest pain, unspecified: Secondary | ICD-10-CM | POA: Diagnosis not present

## 2023-10-08 DIAGNOSIS — R202 Paresthesia of skin: Secondary | ICD-10-CM | POA: Diagnosis not present

## 2023-10-08 DIAGNOSIS — R200 Anesthesia of skin: Secondary | ICD-10-CM | POA: Diagnosis not present

## 2023-10-08 DIAGNOSIS — R0789 Other chest pain: Secondary | ICD-10-CM | POA: Diagnosis not present

## 2023-10-10 ENCOUNTER — Ambulatory Visit (INDEPENDENT_AMBULATORY_CARE_PROVIDER_SITE_OTHER): Admitting: Nurse Practitioner

## 2023-10-10 ENCOUNTER — Encounter: Payer: Self-pay | Admitting: Nurse Practitioner

## 2023-10-10 ENCOUNTER — Ambulatory Visit (HOSPITAL_COMMUNITY)
Admission: RE | Admit: 2023-10-10 | Discharge: 2023-10-10 | Disposition: A | Discharge status: Home / Self Care | Source: Ambulatory Visit | Attending: Nurse Practitioner | Admitting: Nurse Practitioner

## 2023-10-10 VITALS — BP 110/72 | HR 83 | Ht 74.0 in | Wt 254.0 lb

## 2023-10-10 DIAGNOSIS — R202 Paresthesia of skin: Secondary | ICD-10-CM | POA: Insufficient documentation

## 2023-10-10 DIAGNOSIS — M545 Low back pain, unspecified: Secondary | ICD-10-CM | POA: Insufficient documentation

## 2023-10-10 DIAGNOSIS — R200 Anesthesia of skin: Secondary | ICD-10-CM | POA: Diagnosis not present

## 2023-10-10 HISTORY — DX: Low back pain, unspecified: M54.50

## 2023-10-10 HISTORY — DX: Paresthesia of skin: R20.2

## 2023-10-10 NOTE — Progress Notes (Signed)
 Subjective:    Patient ID: Ester GORMAN Bring, male    DOB: [birth year], 28 y.o.   MRN: [id number]  HPI Discussed the use of AI scribe software for clinical note transcription with the patient, who gave verbal consent to proceed.  History of Present Illness DONEVIN SAINSBURY is a 28 year old male who presents with numbness and tingling in the right arm and heaviness in the legs.  Symptoms began last Thursday at approximately 6:40 AM while driving to work. Initially, his right arm went numb, which he attributed to sleeping on it wrong. By the time he arrived at work, the numbness had spread to his left arm. Shortly after, he experienced a tingling sensation in his neck and became lightheaded. Three visits for his symptoms: 9/11 and 9/13.  He underwent blood work at multiple facilities, which returned normal results, including glucose, potassium, kidney function, magnesium, and CBC. Inflammatory markers were negative, and EKGs were normal. Despite these findings, he continues to experience symptoms.  The numbness in his right arm persists, described as a tingling sensation affecting the whole arm and hand, but he denies any weakness. He is able to perform his normal job functions. Sleeping on the couch seemed to alleviate the tingling, but it returned after sleeping in his bed.  He reports heaviness in both legs, which became more pronounced on 11/16/23, making it difficult to lift them. No falls or injuries to his back or neck. He has experienced back pain in the mornings, requiring him to sit on the edge of the bed before standing due to sharp pain in the lower back. Denies bladder or bowel issues.   He mentions a sore feeling in the middle of his chest, which is tender to touch, much improved. No visual changes, difficulty speaking or swallowing, or severe headaches. He has noticed some shallow breathing when walking and reports high anxiety levels last Thursday, which have since decreased.  No history of  back or neck injuries, automobile accidents, or falls at work. He has not had a specialist workup for his back or neck pain.   Review of Systems  Respiratory:  Negative for cough, chest tightness, shortness of breath and wheezing.   Cardiovascular:  Positive for chest pain.       Localized chest discomfort to the lower middle chest with palpation.   Musculoskeletal:  Positive for back pain.  Neurological:  Negative for syncope, facial asymmetry, speech difficulty, weakness and headaches.      10/10/2023   10:18 AM  Depression screen PHQ 2/9  Decreased Interest 0  Down, Depressed, Hopeless 0  PHQ - 2 Score 0  Altered sleeping 0  Tired, decreased energy 0  Change in appetite 1  Feeling bad or failure about yourself  0  Trouble concentrating 0  Moving slowly or fidgety/restless 0  Suicidal thoughts 0  PHQ-9 Score 1  Difficult doing work/chores Not difficult at all      10/10/2023   10:19 AM  GAD 7 : Generalized Anxiety Score  Nervous, Anxious, on Edge 1  Control/stop worrying 0  Worry too much - different things 0  Trouble relaxing 0  Restless 0  Easily annoyed or irritable 0  Afraid - awful might happen 0  Total GAD 7 Score 1  Anxiety Difficulty Not difficult at all    Social History   Tobacco Use   Smoking status: Never   Smokeless tobacco: Current    Types: Chew  Vaping Use   Vaping  status: Never Used  Substance Use Topics   Alcohol use: Yes    Comment: occ   Drug use: No        Objective:   Physical Exam Vitals and nursing note reviewed.  Constitutional:      General: He is not in acute distress. Cardiovascular:     Rate and Rhythm: Normal rate and regular rhythm.     Heart sounds: Normal heart sounds.  Pulmonary:     Effort: Pulmonary effort is normal.     Breath sounds: Normal breath sounds.  Musculoskeletal:        General: Normal range of motion.     Cervical back: Normal range of motion.     Comments: Normal ROM of the back without pain.  Normal ROM of the shoulders without pain. Right arm: sensation grossly intact. Normal hand strength bilaterally. SLR negative bilaterally. Tight tender muscles noted along the cervical and lateral neck.   Skin:    General: Skin is warm and dry.  Neurological:     Mental Status: He is alert and oriented to person, place, and time.     Motor: No weakness.     Gait: Gait normal.     Deep Tendon Reflexes: Reflexes normal.  Psychiatric:        Mood and Affect: Mood normal.        Behavior: Behavior normal.        Thought Content: Thought content normal.        Judgment: Judgment normal.    Today's Vitals   10/10/23 1016  BP: 110/72  Pulse: 83  Weight: 254 lb (115.2 kg)  Height: 6' 2 (1.88 m)   Body mass index is 32.61 kg/m.        Assessment & Plan:  1. Paresthesia of arm (Primary) Intermittent numbness and tingling in right arm and leg heaviness likely due to nerve compression or musculoskeletal strain. No weakness or incontinence. No stroke or cardiac issues. - Order cervical and lumbar spine x-rays. - Recommend massage therapy for lateral neck and back. - Advise on proper sleeping posture for head alignment. - DG Lumbar Spine Complete; Future - DG Cervical Spine Complete; Future  2. Acute bilateral low back pain without sciatica  - DG Lumbar Spine Complete; Future - DG Cervical Spine Complete; Future   Intermittent chest soreness with normal cardiac workup indicates musculoskeletal origin. Possibly related to anxiety or stress. - Reassured regarding non-cardiac origin of chest pain. Chest xray, labs and EKGs all normal.    Return if symptoms worsen or fail to improve.

## 2023-10-12 ENCOUNTER — Encounter: Payer: Self-pay | Admitting: Nurse Practitioner

## 2023-10-15 ENCOUNTER — Ambulatory Visit: Payer: Self-pay | Admitting: Nurse Practitioner

## 2023-10-17 NOTE — Telephone Encounter (Signed)
 Nurses I recommend a follow-up same-day visit with myself or El[facility]ia this week may need to have additional test potentially MRI arranged

## 2023-10-17 NOTE — Telephone Encounter (Signed)
 Spoke with patient and scheduled him for an appt tomorrow at 3:30 pm

## 2023-10-18 ENCOUNTER — Encounter: Payer: Self-pay | Admitting: Family Medicine

## 2023-10-18 ENCOUNTER — Ambulatory Visit: Admitting: Family Medicine

## 2023-10-18 VITALS — BP 113/68 | HR 63 | Temp 98.1°F | Ht 74.0 in | Wt 253.0 lb

## 2023-10-18 DIAGNOSIS — R202 Paresthesia of skin: Secondary | ICD-10-CM

## 2023-10-18 NOTE — Progress Notes (Unsigned)
   Subjective:    Patient ID: Jeremy Webster, male    DOB: Jul 29, 1995, 28 y.o.   MRN: [id number]  HPI Numbness in legs and hands, MRI orders needed patient with significant numbness issues this started off about 2 weeks ago with a sensation of numbness across his back shoulders down into both arms lasted most of the day into the next day he was concerned about it he went to the ER they pretty much ran blood test EKG told him he was not having a heart attack and told him he could go a couple days later he went to [facility] ER with similar concerns of numbness in the arms as well as numbness in both legs and feeling like his legs were heavy they did blood work General Exam and let him go and told him to follow-up with his doctor He followed up with [facility] recently having numbness and his arms hands as well as intermittent numbness in the legs x-rays of the neck and lumbar spine were normal numbness seem to go away but over the past few days he has had a reoccurrence of numbness in the legs along with the sensation that his legs are heavy and difficult to walk with he denies overt weakness denies unilateral numbness weakness no severe headaches no nausea vomiting no fevers no recent injury no family history of these type of issues   Review of Systems     Objective:   Physical Exam Lungs are clear respiratory rate normal heart is regular pulse normal reflexes diminished reflexes in both legs but the strength is good subjective tingling numbness sensation in both legs and also in his arms mainly from the forearm down to the hands He also relates a tingling sensation across his upper back Neurologically there is no focal findings of weakness No ataxia EOMI normal reflexes diminished in the lower legs       Assessment & Plan:  A sensation of lower leg numbness paresthesias as well as legs feeling heavy and somewhat weak Also some intermittent numbness in both arms I am concerned about the possibility  of underlying neurologic issue possibly MS The likelihood of a issue going all the way down the spine is low but it is possible that this could be within the brain and cervical cord region given it is affecting both the upper back arms as well as legs He has gone to the ER twice he has been seen here twice symptoms are not resolving plus also perceived heaviness in his legs with some difficulty ambulating when he has these spells Given all of this it is reasonable to push forward with an MRI to rule out the possibility of MS or some sort of cord lesion in the cervical spine  We will also help set him up for neurology depending on the results of the MRI We will have the staff order MRI of the brain and cervical spine to rule out MS or other demyelinating disease

## 2023-10-19 ENCOUNTER — Ambulatory Visit: Payer: Self-pay | Admitting: Family Medicine

## 2023-10-19 LAB — VITAMIN B12: Vitamin B-12: 448 pg/mL (ref 232–1245)

## 2023-10-20 ENCOUNTER — Encounter: Payer: Self-pay | Admitting: Family Medicine

## 2023-10-20 ENCOUNTER — Other Ambulatory Visit: Payer: Self-pay

## 2023-10-20 DIAGNOSIS — R202 Paresthesia of skin: Secondary | ICD-10-CM

## 2023-10-25 ENCOUNTER — Ambulatory Visit: Payer: Self-pay

## 2023-10-25 ENCOUNTER — Encounter: Payer: Self-pay | Admitting: Nurse Practitioner

## 2023-10-25 ENCOUNTER — Ambulatory Visit (INDEPENDENT_AMBULATORY_CARE_PROVIDER_SITE_OTHER): Admitting: Nurse Practitioner

## 2023-10-25 ENCOUNTER — Telehealth: Payer: Self-pay

## 2023-10-25 VITALS — BP 113/80 | HR 112 | Temp 97.9°F | Ht 74.0 in | Wt 249.4 lb

## 2023-10-25 DIAGNOSIS — R051 Acute cough: Secondary | ICD-10-CM | POA: Insufficient documentation

## 2023-10-25 DIAGNOSIS — R000 Tachycardia, unspecified: Secondary | ICD-10-CM

## 2023-10-25 DIAGNOSIS — R0981 Nasal congestion: Secondary | ICD-10-CM | POA: Diagnosis not present

## 2023-10-25 DIAGNOSIS — J309 Allergic rhinitis, unspecified: Secondary | ICD-10-CM | POA: Diagnosis not present

## 2023-10-25 DIAGNOSIS — R52 Pain, unspecified: Secondary | ICD-10-CM | POA: Insufficient documentation

## 2023-10-25 HISTORY — DX: Allergic rhinitis, unspecified: J30.9

## 2023-10-25 HISTORY — DX: Tachycardia, unspecified: R00.0

## 2023-10-25 LAB — VERITOR FLU A/B WAIVED
Influenza A: NEGATIVE
Influenza B: NEGATIVE

## 2023-10-25 MED ORDER — FLUTICASONE PROPIONATE 50 MCG/ACT NA SUSP: 2.0000 | Freq: Every day | NASAL | 6 refills | Status: AC

## 2023-10-25 MED ORDER — GUAIFENESIN 400 MG PO TABS: 400.0000 mg | ORAL_TABLET | Freq: Four times a day (QID) | ORAL | 0 refills | Status: DC | PRN

## 2023-10-25 MED ORDER — CETIRIZINE HCL 10 MG PO TABS: 10.0000 mg | ORAL_TABLET | Freq: Every day | ORAL | 11 refills | Status: AC

## 2023-10-25 NOTE — Telephone Encounter (Signed)
 Copied from CRM #[id number]. Topic: Clinical - Lab/Test Results >> Oct 25, 2023  9:34 AM Nathanel BROCKS wrote: Reason for CRM: pt is wanting to have location of MRI change to [facility] in [place] fax [phone]  [employer] agent called and stated that the pt requested this and she was calling on his behalf.

## 2023-10-25 NOTE — Progress Notes (Signed)
 Subjective:  Patient ID: Jeremy Webster, male    DOB: Feb 23, 1995, 28 y.o.   MRN: [id number]  Patient Care Team: Alphonsa Glendia LABOR, MD as PCP - General (Family Medicine)   Chief Complaint:  Hypertension (Symptoms started today 130/92 hr 132), elevated heart rate, and Nasal Congestion   HPI: Jeremy Webster is a 27 y.o. male presenting on 10/25/2023 for Hypertension (Symptoms started today 130/92 hr 132), elevated heart rate, and Nasal Congestion   Discussed the use of AI scribe software for clinical note transcription with the patient, who gave verbal consent to proceed.  History of Present Illness Jeremy Webster is a 28 year old male who presents with congestion, body aches, and elevated blood pressure.  He has been experiencing congestion and body aches since yesterday. He also reports a sensation of his pulse in his neck and recorded elevated blood pressure readings of 132/90 mmHg with a heart rate of 132 bpm this morning while at work, despite being sedentary. Last EKG 10/07/2023 normal sinus rhythm  He has taken DayQuil and NyQuil for his congestion but is unsure if they contain the DM component. He has not been around anyone known to be sick, although some coworkers have had congestion.  He feels flushed today but denies having a fever. He also mentions a sore throat and numbness in his hands and arms for the past couple of weeks, with the numbness occasionally moving to his legs. He has visited the emergency room three times for these symptoms, and all blood work has returned normal. An MRI is planned, pending orders from his primary care physician.  No significant changes in appetite but notes not feeling very hungry today and only consuming a bar this morning.      Relevant past medical, surgical, family, and social history reviewed and updated as indicated.  Allergies and medications reviewed and updated. Data reviewed: Chart in Epic.   History reviewed. No pertinent past medical  history.  Past Surgical History:  Procedure Laterality Date   ADENOIDECTOMY     TONSILLECTOMY AND ADENOIDECTOMY  08/27/04    Social History   Socioeconomic History   Marital status: Single    Spouse name: Not on file   Number of children: Not on file   Years of education: Not on file   Highest education level: Not on file  Occupational History   Not on file  Tobacco Use   Smoking status: Never   Smokeless tobacco: Current    Types: Chew  Vaping Use   Vaping status: Never Used  Substance and Sexual Activity   Alcohol use: Yes    Comment: occ   Drug use: No   Sexual activity: Yes    Birth control/protection: None  Other Topics Concern   Not on file  Social History Narrative   Not on file   Social Drivers of Health   Financial Resource Strain: Not on file  Food Insecurity: Not on file  Transportation Needs: Not on file  Physical Activity: Not on file  Stress: Not on file  Social Connections: Not on file  Intimate Partner Violence: Not At Risk (10/08/2023)   Received from [facility]   HITS    Over the last 12 months how often did your partner physically hurt you?: Never    Over the last 12 months how often did your partner insult you or talk down to you?: Never    Over the last 12 months how often did  your partner threaten you with physical harm?: Never    Over the last 12 months how often did your partner scream or curse at you?: Never    Outpatient Encounter Medications as of 10/25/2023  Medication Sig   azelastine  (ASTELIN ) 0.1 % nasal spray 2 sprays into each nostril twice daily (Patient not taking: Reported on 10/25/2023)   dicyclomine  (BENTYL ) 10 MG capsule Take 1 capsule (10 mg total) by mouth 4 (four) times daily -  before meals and at bedtime. (Patient not taking: Reported on 10/25/2023)   No facility-administered encounter medications on file as of 10/25/2023.    Allergies  Allergen Reactions   Amoxil [Amoxicillin]    Bactrim  [Sulfamethoxazole-Trimethoprim]    Cefzil [Cefprozil]     Pertinent ROS per HPI, otherwise unremarkable      Objective:  BP 113/80   Pulse (!) 112   Temp 97.9 F (36.6 C) (Temporal)   Ht 6' 2 (1.88 m)   Wt 249 lb 6.4 oz (113.1 kg)   SpO2 98%   BMI 32.02 kg/m    Wt Readings from Last 3 Encounters:  10/25/23 249 lb 6.4 oz (113.1 kg)  10/18/23 253 lb (114.8 kg)  10/10/23 254 lb (115.2 kg)    Physical Exam Vitals and nursing note reviewed.  HENT:     Nose: Congestion present. No septal deviation.     Right Turbinates: Swollen.     Left Turbinates: Swollen.     Mouth/Throat:     Lips: Pink.     Mouth: Mucous membranes are moist.     Pharynx: Postnasal drip present. No posterior oropharyngeal erythema.     Tonsils: No tonsillar exudate.  Eyes:     General: No scleral icterus.    Extraocular Movements: Extraocular movements intact.     Conjunctiva/sclera: Conjunctivae normal.     Pupils: Pupils are equal, round, and reactive to light.  Neck:     Vascular: No carotid bruit.  Cardiovascular:     Heart sounds: Normal heart sounds.  Pulmonary:     Effort: Pulmonary effort is normal.     Breath sounds: Normal breath sounds.  Musculoskeletal:        General: Normal range of motion.     Cervical back: Normal range of motion and neck supple. No rigidity or tenderness.     Right lower leg: No edema.     Left lower leg: No edema.  Lymphadenopathy:     Cervical: No cervical adenopathy.  Skin:    General: Skin is warm and dry.     Findings: No rash.  Neurological:     Mental Status: He is oriented to person, place, and time.  Psychiatric:        Mood and Affect: Mood normal.        Behavior: Behavior normal.        Thought Content: Thought content normal.        Judgment: Judgment normal.    Physical Exam HEENT: Nasal mucosa erythematous, suggestive of allergies. Throat normal.     Results for orders placed or performed in visit on 10/18/23  Vitamin B12    Collection Time: 10/18/23  4:21 PM  Result Value Ref Range   Vitamin B-12 448 232 - 1,245 pg/mL      Flu A and  B negative Pertinent labs & imaging results that were available during my care of the patient were reviewed by me and considered in my medical decision making.  Assessment & Plan:  Jeremy Webster was seen today for hypertension, elevated heart rate and nasal congestion.  Diagnoses and all orders for this visit:  Body aches -     Veritor Flu A/B Waived  Nasal congestion -     Veritor Flu A/B Waived  Allergic rhinitis, unspecified seasonality, unspecified trigger  Acute cough     Assessment and Plan Assessment & Plan Acute allergic rhinitis with postnasal drip Acute allergic rhinitis with significant postnasal drip, erythematous turbinates indicating allergic component. - Swab for influenza to rule out viral infection.  Acute upper respiratory symptoms Acute upper respiratory symptoms with congestion, sore throat, and body aches. No fever, no significant throat abnormalities. - Swab for influenza to rule out viral infection.  Intermittent hypertension and tachycardia, likely medication-induced Intermittent hypertension and tachycardia likely due to DayQuil and NyQuil use. Blood pressure 132/90 mmHg, heart rate 132 bpm. - Advise avoiding medications with 'DM' as he can increase blood pressure.  Paresthesia of hands and arms, under evaluation Paresthesia of hands and arms, previously noted to have included legs. Previous evaluations normal, awaiting MRI. - Follow up with PCP to expedite MRI orders.  Tachycardia: BMP to r/u electrolytes imbalance    Continue all other maintenance medications.  Follow up plan: No follow-ups on file.   Continue healthy lifestyle choices, including diet (rich in fruits, vegetables, and lean proteins, and low in salt and simple carbohydrates) and exercise (at least 30 minutes of moderate physical activity daily).  Educational handout  given for    Clinical References  Cough, Adult A cough helps to clear your throat and lungs. It may be a sign of an illness or another condition. A short-term (acute) cough may last 2-3 weeks. A long-term (chronic) cough may last 8 or more weeks. Many things can cause a cough. They include: Illnesses such as: An infection in your throat or lungs. Asthma or other heart or lung problems. Gastroesophageal reflux. This is when acid comes back up from your stomach. Breathing in things that bother (irritate) your lungs. Allergies. Postnasal drip. This is when mucus runs down the back of your throat. Smoking. Some medicines. Follow these instructions at home: Medicines Take over-the-counter and prescription medicines only as told by your doctor. Talk with your doctor before you take cough medicine (cough suppressants). Eating and drinking Do not drink alcohol. Do not drink caffeine. Drink enough fluid to keep your pee (urine) pale yellow. Lifestyle Stay away from cigarette smoke. Do not smoke or use any products that contain nicotine or tobacco. If you need help quitting, ask your doctor. Stay away from things that make you cough. These may include perfume, candles, cleaning products, or campfire smoke. General instructions  Watch for any changes to your cough. Tell your doctor about them. Always cover your mouth when you cough. If the air is dry in your home, use a cool mist vaporizer or humidifier. If your cough is worse at night, try using extra pillows to raise your head up higher while you sleep. Rest as needed. Contact a doctor if: You have new symptoms. Your symptoms get worse. You cough up pus. You have a fever that does not go away. Your cough does not get better after 2-3 weeks. Cough medicine does not help, and you are not sleeping well. You have pain that gets worse or is not helped with medicine. You are losing weight and do not know why. You have night sweats. Get  help right away if: You cough up blood. You have trouble breathing.  Your heart is beating very fast. These symptoms may be an emergency. Get help right away. Call 911. Do not wait to see if the symptoms will go away. Do not drive yourself to the hospital. This information is not intended to replace advice given to you by your health care provider. Make sure you discuss any questions you have with your health care provider. Document Revised: 09/11/2021 Document Reviewed: 09/11/2021 Elsevier Patient Education  2024 Elsevier Inc. Allergic Rhinitis, Adult  Allergic rhinitis is a reaction to allergens. Allergens are things that can cause an allergic reaction. This condition affects the lining inside the nose (mucous membrane). There are two types of allergic rhinitis: Seasonal. This type is also called hay fever. It happens only during some times of the year. Perennial. This type can happen at any time of the year. This condition cannot be spread from person to person (is not contagious). It can be mild, bad, or very bad. It can develop at any age and may be outgrown. What are the causes? Pollen from grasses, trees, and weeds. Other causes can be: Dust mites. Smoke. Mold. Car fumes. The pee (urine), spit, or dander of pets. Dander is dead skin cells from a pet. What increases the risk? You are more likely to develop this condition if: You have allergies in your family. You have problems like allergies in your family. You may have: Swelling of parts of your eyes and eyelids. Asthma. This affects how you breathe. Long-term redness and swelling on your skin. Food allergies. What are the signs or symptoms? The main symptom of this condition is a runny or stuffy nose (nasal congestion). Other symptoms may include: Sneezing or coughing. Itching and tearing of your eyes. Mucus that drips down the back of your throat (postnasal drip). This may cause a sore throat. Trouble sleeping. Feeling  tired. Headache. How is this treated? There is no cure for this condition. You should avoid things that you are allergic to. Treatment can help to relieve symptoms. This may include: Medicines that block allergy symptoms, such as anti-inflammatories or antihistamines. These may be given as a shot, nasal spray, or pill. Avoiding things you are allergic to. Medicines that give you some of what you are allergic to over time. This is called immunotherapy. It is done if other treatments do not help. You may get: Shots. Medicine under your tongue. Stronger medicines, if other treatments do not help. Follow these instructions at home: Avoiding allergens Find out what things you are allergic to and avoid them. To do this, try these things: If you get allergies any time of year: Replace carpet with wood, tile, or vinyl flooring. Carpet can trap pet dander and dust. Do not smoke. Do not allow smoking in your home. Change your heating and air conditioning filters at least once a month. If you get allergies only some times of the year: Keep windows closed when you can. Plan things to do outside when pollen counts are lowest. Check pollen counts before you plan things to do outside. When you come indoors, change your clothes and shower before you sit on furniture or bedding. If you are allergic to a pet: Keep the pet out of your bedroom. Vacuum, sweep, and dust often. General instructions Take over-the-counter and prescription medicines only as told by your doctor. Drink enough fluid to keep your pee pale yellow. Where to find more information American Academy of Allergy, Asthma & Immunology: aaaai.org Contact a doctor if: You have a fever. You  get a cough that does not go away. You make high-pitched whistling sounds when you breathe, most often when you breathe out (wheeze). Your symptoms slow you down. Your symptoms stop you from doing your normal things each day. Get help right away if: You  are short of breath. This symptom may be an emergency. Get help right away. Call 911. Do not wait to see if the symptom will go away. Do not drive yourself to the hospital. This information is not intended to replace advice given to you by your health care provider. Make sure you discuss any questions you have with your health care provider. Document Revised: 09/21/2021 Document Reviewed: 09/21/2021 Elsevier Patient Education  2024 Elsevier Inc. Sinus Tachycardia  Sinus tachycardia is a fast heartbeat. In sinus tachycardia, the heart beats more than 100 times a minute. Sinus tachycardia starts in the part of the heart called the sinoatrial (SA) node. Sinus tachycardia may be harmless, or it may be a sign of a serious condition. What are the causes? This condition may be caused by: Exercise or exertion. A fever. Pain. Loss of body fluids (dehydration). Severe bleeding (hemorrhage). Anxiety and stress. Certain substances, including: Alcohol. Caffeine. Tobacco and nicotine products. Cold medicines. Illegal drugs. Medical conditions including: Heart disease. An infection. An overactive thyroid (hyperthyroidism). A lack of red blood cells (anemia). What are the signs or symptoms? Symptoms of this condition include: A feeling that the heart is beating fast or unevenly (palpitations). Suddenly noticing your heartbeat (cardiac awareness). Lightheadedness. Tiredness (fatigue). Shortness of breath. Chest pain. Nausea. Fainting. How is this diagnosed? This condition is diagnosed with: A physical exam. Tests or monitoring, such as: Blood tests. An electrocardiogram (ECG). This test measures the electrical activity of the heart. Ambulatory cardiac monitor. This records your heartbeats for 24 hours or more. You may be referred to a heart specialist (cardiologist). How is this treated? Treatment for this condition depends on the cause. Treatment may involve: Treating the underlying  condition. Taking new medicines or changing your current medicines as told by your health care provider. Making changes to your diet or lifestyle. Follow these instructions at home: Lifestyle  Do not use any products that contain nicotine or tobacco. These products include cigarettes, chewing tobacco, and vaping devices, such as e-cigarettes. If you need help quitting, ask your health care provider. Do not use illegal drugs, such as cocaine. Learn relaxation methods to help you when you get stressed or anxious. These include deep breathing. Avoid caffeine or other stimulants, including herbal stimulants that are found in energy drinks. Alcohol use  Do not drink alcohol if: Your health care provider tells you not to drink. You are pregnant, may be pregnant, or are planning to become pregnant. If you drink alcohol: Limit how much you have to: 0-1 drink a day for women. 0-2 drinks a day for men. Know how much alcohol is in your drink. In the [place] one drink equals one 12 oz bottle of beer (355 mL), one 5 oz glass of wine (148 mL), or one 1 oz glass of hard liquor (44 mL). General instructions Drink enough fluids to keep your urine pale yellow. Take over-the-counter and prescription medicines only as told by your health care provider. Ask your health care provider about taking vitamins, herbs, and supplements. Contact a health care provider if: You have vomiting or diarrhea that does not go away. You have a fever. You have weakness or dizziness. You feel faint. Get help right away if: You have  pain in your chest, upper arms, jaw, or neck. You have palpitations that do not go away. Summary In sinus tachycardia, the heart beats more than 100 times a minute. Sinus tachycardia may be harmless, or it may be a sign of a serious condition. Treatment for this condition depends on the cause or the underlying condition. Get help right away if you have pain in your chest, upper arms, jaw, or  neck. This information is not intended to replace advice given to you by your health care provider. Make sure you discuss any questions you have with your health care provider. Document Revised: 05/12/2021 Document Reviewed: 05/12/2021 Elsevier Patient Education  2024 Elsevier Inc. Paresthesia Paresthesia is a burning or prickling feeling. This feeling can happen in any part of the body. It often happens in the hands, arms, legs, or feet. Usually, it is not painful. In most cases, the feeling goes away in a short time and is not a sign of a serious problem. If you have paresthesia that lasts a long time, you need to see your doctor. Follow these instructions at home: Nutrition Eat a healthy diet. This includes: Eating foods that are high in fiber. These include beans, whole grains, and fresh fruits and vegetables. Limiting foods that are high in fat and sugar. These include fried or sweet foods.   Alcohol use  Do not drink alcohol if: Your doctor tells you not to drink. You are pregnant, may be pregnant, or are planning to become pregnant. If you drink alcohol: Limit how much you have to: 0-1 drink a day for women. 0-2 drinks a day for men. Know how much alcohol is in your drink. In the [place] one drink equals one 12 oz bottle of beer (355 mL), one 5 oz glass of wine (148 mL), or one 1 oz glass of hard liquor (44 mL). General instructions Take over-the-counter and prescription medicines only as told by your doctor. Do not smoke or use any products that contain nicotine or tobacco. If you need help quitting, ask your doctor. If you have diabetes, work with your doctor to make sure your blood sugar stays in a healthy range. If your feet feel numb: Check for redness, warmth, and swelling every day. Wear padded socks and comfortable shoes. These help protect your feet. Keep all follow-up visits. Contact a doctor if: You have paresthesia that gets worse or does not go away. You lose  feeling (have numbness) after an injury. Your burning or prickling feeling gets worse when you walk. You have pain or cramps. You feel dizzy or you faint. You have a rash. Get help right away if: You feel weak or have new weakness in an arm or leg. You have trouble walking or moving. You have problems speaking, understanding, or seeing. You feel confused. You cannot control when you pee (urinate) or poop (have a bowel movement). These symptoms may be an emergency. Get help right away. Call 911. Do not wait to see if the symptoms will go away. Do not drive yourself to the hospital. Summary Paresthesia is a burning or prickling feeling. It often happens in the hands, arms, legs, or feet. In most cases, the feeling goes away in a short time and is not a sign of a serious problem. If you have paresthesia that lasts a long time, you need to be seen by your doctor. This information is not intended to replace advice given to you by your health care provider. Make sure you discuss any  questions you have with your health care provider. Document Revised: 09/22/2020 Document Reviewed: 09/22/2020 Elsevier Patient Education  2024 Elsevier Inc.  The above assessment and management plan was discussed with the patient. The patient verbalized understanding of and has agreed to the management plan. Patient is aware to call the clinic if they develop any new symptoms or if symptoms persist or worsen. Patient is aware when to return to the clinic for a follow-up visit. Patient educated on when it is appropriate to go to the emergency department.   Jeremy Tavella [place] Thompson, DNP [facility] [address] [place], [place] [address] [phone]

## 2023-10-25 NOTE — Telephone Encounter (Signed)
 FYI Only or Action Required?: FYI only for provider.  Patient was last seen in primary care on 10/18/2023 by Alphonsa Glendia LABOR, MD.  Called Nurse Triage reporting Multiple Complaints.  Symptoms began today.  Interventions attempted: Rest, hydration, or home remedies.  Symptoms are: unchanged.  Triage Disposition: See HCP Within 4 Hours (Or PCP Triage)  Patient/caregiver understands and will follow disposition?: Yes Answer Assessment - Initial Assessment Questions Patient was transferred to this RN for multiple complaints.   Has continuation of bilateral extremity paresthesia (started 10/06/23) that he states was improving, but was present earlier today again. Not currently occurring.   States his BP today was 132/90 and his HR was 132. States he feels like his heart is racing. Denies hx of same.  Also reports congestion x 2 days. States feels a little warm, but has not checked his temperature.  Patient denies higher acuity questions including current numbness/tingling of extremities, chest pain, SOB, dizziness. Recheck HR at 1213 is . ED precautions reviewed, pt verbalized understanding.  Protocols used: Heart Rate and Heartbeat Questions-A-AH Copied from CRM [id number]. Topic: Clinical - Pink Word Triage >> Oct 25, 2023 12:02 PM Darshell M wrote: Reason for Triage: patient with increased heart. BP 132/90, body aches, over all not feeling well

## 2023-10-26 ENCOUNTER — Ambulatory Visit: Payer: Self-pay | Admitting: Nurse Practitioner

## 2023-10-26 LAB — BMP8+EGFR
BUN/Creatinine Ratio: 7 — ABNORMAL LOW (ref 9–20)
BUN: 7 mg/dL (ref 6–20)
CO2: 23 mmol/L (ref 20–29)
Calcium: 10.2 mg/dL (ref 8.7–10.2)
Chloride: 102 mmol/L (ref 96–106)
Creatinine, Ser: 0.96 mg/dL (ref 0.76–1.27)
Glucose: 96 mg/dL (ref 70–99)
Potassium: 4.7 mmol/L (ref 3.5–5.2)
Sodium: 141 mmol/L (ref 134–144)
eGFR: 112 mL/min/1.73 (ref >59)

## 2023-10-27 ENCOUNTER — Other Ambulatory Visit: Payer: Self-pay

## 2023-10-27 ENCOUNTER — Emergency Department (HOSPITAL_COMMUNITY)
Admission: EM | Admit: 2023-10-27 | Discharge: 2023-10-27 | Disposition: A | Discharge status: Home / Self Care | Attending: Emergency Medicine | Admitting: Emergency Medicine

## 2023-10-27 ENCOUNTER — Emergency Department (HOSPITAL_COMMUNITY)

## 2023-10-27 ENCOUNTER — Ambulatory Visit: Payer: Self-pay

## 2023-10-27 ENCOUNTER — Encounter (HOSPITAL_COMMUNITY): Payer: Self-pay

## 2023-10-27 DIAGNOSIS — R0602 Shortness of breath: Secondary | ICD-10-CM | POA: Insufficient documentation

## 2023-10-27 DIAGNOSIS — D72829 Elevated white blood cell count, unspecified: Secondary | ICD-10-CM | POA: Insufficient documentation

## 2023-10-27 DIAGNOSIS — R110 Nausea: Secondary | ICD-10-CM | POA: Diagnosis not present

## 2023-10-27 DIAGNOSIS — R059 Cough, unspecified: Secondary | ICD-10-CM | POA: Insufficient documentation

## 2023-10-27 DIAGNOSIS — R0789 Other chest pain: Secondary | ICD-10-CM | POA: Insufficient documentation

## 2023-10-27 DIAGNOSIS — R202 Paresthesia of skin: Secondary | ICD-10-CM | POA: Diagnosis not present

## 2023-10-27 DIAGNOSIS — J9811 Atelectasis: Secondary | ICD-10-CM | POA: Diagnosis not present

## 2023-10-27 LAB — CBC WITH DIFFERENTIAL/PLATELET
Abs Immature Granulocytes: 0.03 K/uL (ref 0.00–0.07)
Basophils Absolute: 0 K/uL (ref 0.0–0.1)
Basophils Relative: 0 %
Eosinophils Absolute: 0.1 K/uL (ref 0.0–0.5)
Eosinophils Relative: 1 %
HCT: 51.3 % (ref 39.0–52.0)
Hemoglobin: 17.4 g/dL — ABNORMAL HIGH (ref 13.0–17.0)
Immature Granulocytes: 0 %
Lymphocytes Relative: 15 %
Lymphs Abs: 1.6 K/uL (ref 0.7–4.0)
MCH: 28.9 pg (ref 26.0–34.0)
MCHC: 33.9 g/dL (ref 30.0–36.0)
MCV: 85.2 fL (ref 80.0–100.0)
Monocytes Absolute: 0.8 K/uL (ref 0.1–1.0)
Monocytes Relative: 7 %
Neutro Abs: 8.5 K/uL — ABNORMAL HIGH (ref 1.7–7.7)
Neutrophils Relative %: 77 %
Platelets: 333 K/uL (ref 150–400)
RBC: 6.02 MIL/uL — ABNORMAL HIGH (ref 4.22–5.81)
RDW: 12.4 % (ref 11.5–15.5)
WBC: 11 K/uL — ABNORMAL HIGH (ref 4.0–10.5)
nRBC: 0 % (ref 0.0–0.2)

## 2023-10-27 LAB — RESP PANEL BY RT-PCR (RSV, FLU A&B, COVID)  RVPGX2
Influenza A by PCR: NEGATIVE
Influenza B by PCR: NEGATIVE
Resp Syncytial Virus by PCR: NEGATIVE
SARS Coronavirus 2 by RT PCR: NEGATIVE

## 2023-10-27 LAB — LIPASE, BLOOD: Lipase: 20 U/L (ref 11–51)

## 2023-10-27 LAB — COMPREHENSIVE METABOLIC PANEL WITH GFR
ALT: 20 U/L (ref 0–44)
AST: 32 U/L (ref 15–41)
Albumin: 4.7 g/dL (ref 3.5–5.0)
Alkaline Phosphatase: 71 U/L (ref 38–126)
Anion gap: 13 (ref 5–15)
BUN: 13 mg/dL (ref 6–20)
CO2: 24 mmol/L (ref 22–32)
Calcium: 9.9 mg/dL (ref 8.9–10.3)
Chloride: 99 mmol/L (ref 98–111)
Creatinine, Ser: 1 mg/dL (ref 0.61–1.24)
GFR, Estimated: >60 mL/min (ref >60)
Glucose, Bld: 106 mg/dL — ABNORMAL HIGH (ref 70–99)
Potassium: 5.3 mmol/L — ABNORMAL HIGH (ref 3.5–5.1)
Sodium: 136 mmol/L (ref 135–145)
Total Bilirubin: 0.8 mg/dL (ref 0.0–1.2)
Total Protein: 8.1 g/dL (ref 6.5–8.1)

## 2023-10-27 LAB — TROPONIN T, HIGH SENSITIVITY: Troponin T High Sensitivity: <15 ng/L (ref 0–19)

## 2023-10-27 MED ORDER — IOHEXOL 350 MG/ML SOLN
150.0000 mL | Freq: Once | INTRAVENOUS | Status: AC | PRN
  Administered 2023-10-27: 150 mL via INTRAVENOUS

## 2023-10-27 NOTE — ED Provider Notes (Signed)
 [place] EMERGENCY DEPARTMENT AT [facility] Provider Note   CSN: [id number] Arrival date & time: 10/27/23  9089     Patient presents with: Nausea   Jeremy Webster is a 28 y.o. male otherwise healthy presents accompanied by his mother with complaints of chest pain, shortness of breath, nausea, left-sided facial and upper extremity numbness and tingling.  Reports that he has had these intermittent symptoms over the past few weeks.  Has 2 other ER visits in the past month including  [facility] and [place] [facility].  Workup overall reassuring during those occasions consisting of lab work without imaging.  Patient has no cardiac history.  No prior blood clots.  No recent surgeries, hospitalizations or extended travel. He has no prior abdominal surgeries.  Has had no vomiting does endorse some diarrhea.  He does admit to recent nonproductive cough.   Reports that he has pulled some ticks off him in the past year.  He is curious whether he has Lyme disease.  Notes that several weeks ago he did have some myalgias.  He has no other current URI symptoms.  His chest pain is worse with certain movements and is not exertional.  Does report family history of aneurysms.  He has upcoming outpatient brain and cervical MRIs.   HPI   History reviewed. No pertinent past medical history. Past Surgical History:  Procedure Laterality Date   ADENOIDECTOMY     TONSILLECTOMY AND ADENOIDECTOMY  08/27/04     Prior to Admission medications   Medication Sig Start Date End Date Taking? Authorizing Provider  azelastine  (ASTELIN ) 0.1 % nasal spray 2 sprays into each nostril twice daily Patient not taking: Reported on 10/25/2023 05/03/18   Alphonsa Glendia LABOR, MD  cetirizine (ZYRTEC) 10 MG tablet Take 1 tablet (10 mg total) by mouth daily. 10/25/23   [facility] Pool, NP  dicyclomine  (BENTYL ) 10 MG capsule Take 1 capsule (10 mg total) by mouth 4 (four) times daily -  before meals and at bedtime. Patient not taking:  Reported on 10/25/2023 12/11/19   Waddell Catholic M, DO  fluticasone (FLONASE) 50 MCG/ACT nasal spray Place 2 sprays into both nostrils daily. 10/25/23   [facility] Pool, NP  guaifenesin (HUMIBID E) 400 MG TABS tablet Take 1 tablet (400 mg total) by mouth every 6 (six) hours as needed. 10/25/23   [facility] Pool, NP    Allergies: Amoxil [amoxicillin], Bactrim [sulfamethoxazole-trimethoprim], and Cefzil [cefprozil]    Review of Systems  Neurological:  Positive for numbness.    Updated Vital Signs BP 111/74 (BP Location: Right Arm)   Pulse (!) 58   Temp 98.5 F (36.9 C) (Oral)   Resp 14   Ht 6' 2 (1.88 m)   Wt 113.1 kg   SpO2 96%   BMI 32.02 kg/m   Physical Exam Vitals and nursing note reviewed.  Constitutional:      General: He is not in acute distress.    Appearance: He is well-developed.  HENT:     Head: Normocephalic and atraumatic.  Eyes:     Conjunctiva/sclera: Conjunctivae normal.  Cardiovascular:     Rate and Rhythm: Normal rate and regular rhythm.     Heart sounds: No murmur heard. Pulmonary:     Effort: Pulmonary effort is normal. No respiratory distress.     Breath sounds: Normal breath sounds.  Abdominal:     Palpations: Abdomen is soft.     Tenderness: There is no abdominal tenderness.  Musculoskeletal:  General: No swelling.     Cervical back: Neck supple.  Skin:    General: Skin is warm and dry.     Capillary Refill: Capillary refill takes less than 2 seconds.  Neurological:     Mental Status: He is alert.     Comments: Patient is alert and oriented. There is no abnormal phonation. Symmetric smile without facial droop. No pronator drift. Moves all extremities spontaneously. 5/5 strength in upper and lower extremities. . No sensation deficit. There is no nystagmus. EOMI, PERRL. Coordination intact with finger to nose.  Negative Tinel's over carpal and cubital canals.  Negative Spurling's   Psychiatric:        Mood and Affect: Mood  normal.     (all labs ordered are listed, but only abnormal results are displayed) Labs Reviewed  CBC WITH DIFFERENTIAL/PLATELET - Abnormal; Notable for the following components:      Result Value   WBC 11.0 (*)    RBC 6.02 (*)    Hemoglobin 17.4 (*)    Neutro Abs 8.5 (*)    All other components within normal limits  COMPREHENSIVE METABOLIC PANEL WITH GFR - Abnormal; Notable for the following components:   Potassium 5.3 (*)    Glucose, Bld 106 (*)    All other components within normal limits  RESP PANEL BY RT-PCR (RSV, FLU A&B, COVID)  [id number]  LYME DISEASE SEROLOGY W/REFLEX  LIPASE, BLOOD  TROPONIN T, HIGH SENSITIVITY    EKG: EKG Interpretation Date/Time:  Thursday October 27 2023 09:20:33 EDT Ventricular Rate:  74 PR Interval:  170 QRS Duration:  92 QT Interval:  350 QTC Calculation: 388 R Axis:   53  Text Interpretation: Normal sinus rhythm with sinus arrhythmia Normal ECG Confirmed by Yolande Charleston [phone]) on 10/28/2023 1:37:54 PM  Radiology: No results found.   Procedures   Medications Ordered in the ED  iohexol (OMNIPAQUE) 350 MG/ML injection 150 mL (150 mLs Intravenous Contrast Given 10/27/23 1054)    Clinical Course as of 11/02/23 0644  Thu Oct 27, 2023  0955 Otherwise healthy patient evaluated for 1 month of left-sided facial and upper extremity paresthesias with associated chest pain, shortness of breath, nausea and cough.  Patient is hemodynamically stable.  On exam his lungs are clear, his abdomen is nontender.  He has no neurodeficits.  He is resting comfortably.  Differential is broad at this time.  Reassuringly he is PERC negative and has no cardiac history or significant risk factors.  However given that this is now his third ER visit I have offered a more extensive workup.  With family history of aneurysms will obtain CT imaging today of head neck and dissection study.  Will repeat lab work as well.  Will include Lyme panel. [JT]  1001 EKG  12-Lead Sinus rhythm without ischemic changes [JT]  1029 CBC with Differential(!) Mild leukocytosis [JT]  1053 Comprehensive metabolic panel(!) Potassium of 5.3, no EKG changes [JT]  1053 Troponin T, High Sensitivity Without elevation. [JT]  1058 Lipase, blood Within normal limits [JT]  1138 Resp panel by RT-PCR (RSV, Flu A&B, Covid) Anterior Nasal Swab Negative [JT]  1159 CT angio head, neck,/dissection study overall without any acute abnormality [JT]  Wed Nov 02, 2023  0642 Workup reassuring.  No clear etiology for patient's symptoms.  Explained to the patient that requested Lyme panel is still pending and can be followed up with her PCP.  No clear etiology for mild hyperkalemia at this time.  Will follow-up with PCP  for recheck.  Additionally has outpatient MRIs scheduled in the next coming days.  Discussed strict return precautions.  Patient and mom are understanding agreement plan. [JT]    Clinical Course User Index [JT] Donnajean Lynwood DEL, PA-C                                 Medical Decision Making Amount and/or Complexity of Data Reviewed Labs: ordered. Decision-making details documented in ED Course. Radiology: ordered. ECG/medicine tests:  Decision-making details documented in ED Course.  Risk Prescription drug management.   This patient presents to the ED with chief complaint(s) of numbness.  The complaint involves an extensive differential diagnosis and also carries with it a high risk of complications and morbidity.   Pertinent past medical history as listed in HPI  The differential diagnosis includes  Aortic dissection, PE, vertebral artery dissection, spinal stenosis, carpal/cubital tunnel, ACS, electrolyte abnormality, Lyme disease   Additional history obtained: Additional history obtained from family Records reviewed Care Everywhere/External Records  Disposition:   Patient will be discharged home. The patient has been appropriately medically screened and/or  stabilized in the ED. I have low suspicion for any other emergent medical condition which would require further screening, evaluation or treatment in the ED or require inpatient management. At time of discharge the patient is hemodynamically stable and in no acute distress. I have discussed work-up results and diagnosis with patient and answered all questions. Patient is agreeable with discharge plan. We discussed strict return precautions for returning to the emergency department and they verbalized understanding.     Social Determinants of Health:   none  This note was dictated with voice recognition software.  Despite best efforts at proofreading, errors may have occurred which can change the documentation meaning.       Final diagnoses:  Paresthesias  Atypical chest pain  Nausea    ED Discharge Orders     None          Donnajean Lynwood DEL DEVONNA 11/02/23 9355    Suzette Pac, MD 11/03/23 1121

## 2023-10-27 NOTE — Telephone Encounter (Signed)
 FYI Only or Action Required?: FYI only for provider.  Patient was last seen in primary care on 10/25/2023 by Deitra Morton Sebastian Nena, NP.  Called Nurse Triage reporting Numbness.  Symptoms began several weeks ago.  Interventions attempted: Rest, hydration, or home remedies.  Symptoms are: gradually worsening.  Triage Disposition: Go to ED Now (or PCP Triage)  Patient/caregiver understands and will follow disposition?: Yes        Copied from CRM [id number]. Topic: Clinical - Red Word Triage >> Oct 27, 2023  7:57 AM Rosaria BRAVO wrote: Red Word that prompted transfer to Nurse Triage: Tingling numbness in his arms and legs, has MRI Sunday. Nausea/Sweatiness, feels off.    ----------------------------------------------------------------------- From previous Reason for Contact - Scheduling: Patient/patient representative is calling to schedule an appointment. Refer to attachments for appointment information. Reason for Disposition  Patient sounds very sick or weak to the triager    Pt reports recent AV with alternate [facility] with similar sx reported today, but now worsening sx along with intermittent CP and sweating. Triager advised ED for further evaluation/tx.  Answer Assessment - Initial Assessment Questions 1. SYMPTOM: What is the main symptom you are concerned about? (e.g., weakness, numbness)     Numbness/tingling in arms 2. ONSET: When did this start? (e.g., minutes, hours, days; while sleeping)     3 weeks 3. LAST NORMAL: When was the last time you (the patient) were normal (no symptoms)?     *No Answer* 4. PATTERN Does this come and go, or has it been constant since it started?  Is it present now?     intermittent 5. CARDIAC SYMPTOMS: Have you had any of the following symptoms: chest pain, difficulty breathing, palpitations?     CP - endorses may be related to anxiety 6. NEUROLOGIC SYMPTOMS: Have you had any of the following symptoms: headache, dizziness,  vision loss, double vision, changes in speech, unsteady on your feet?     Intermittent H/A, lightheadednes, poor appetite 7. OTHER SYMPTOMS: Do you have any other symptoms?     Congestion, body aches, nausea, sweaty. Afebrile. 8. PREGNANCY: Is there any chance you are pregnant? When was your last menstrual period?     N/a  Protocols used: Neurologic Deficit-A-AH

## 2023-10-27 NOTE — ED Triage Notes (Signed)
 Pt arrived via POV c/o nausea, jaw tingling and intermittent chest pressure since last month. Pt seen at [facility] ER and [facility] last month and discharged for same complaint. Pt reports he feels short of breath as well with little exertion. Pt also expresses concern for possible lyme disease following a few tick bites this year.

## 2023-10-27 NOTE — Telephone Encounter (Signed)
Some noted thank you °

## 2023-10-27 NOTE — Discharge Instructions (Signed)
 You were evaluated in the emergency room for chest pain, nausea and paresthesias.  Your lab work and imaging did not show any significant abnormality.  Please follow-up with your primary care doctor for further evaluation.

## 2023-10-28 ENCOUNTER — Other Ambulatory Visit: Payer: Self-pay

## 2023-10-28 ENCOUNTER — Telehealth: Payer: Self-pay

## 2023-10-28 ENCOUNTER — Telehealth: Payer: Self-pay | Admitting: *Deleted

## 2023-10-28 ENCOUNTER — Telehealth: Payer: Self-pay | Admitting: Family Medicine

## 2023-10-28 DIAGNOSIS — R202 Paresthesia of skin: Secondary | ICD-10-CM

## 2023-10-28 LAB — LYME DISEASE SEROLOGY W/REFLEX: Lyme Total Antibody EIA: NEGATIVE

## 2023-10-28 NOTE — Telephone Encounter (Signed)
 Call [facility] in [place] to reschedule MRI w/ w/o as noted

## 2023-10-28 NOTE — Telephone Encounter (Signed)
 This has been addressed , pt is ok with rescheduling with [facility] later next week.

## 2023-10-28 NOTE — Telephone Encounter (Signed)
 Nurses Please have referral team work with the He has MRI set up for October 5   I spoke with the neuro MRI specialist with radiology The neuroradiologist stated that the patient should have  MRI of the brain with and without contrast as well as MRI cervical spine with and without contrast  Reason-cognitive decline, paresthesias of the arms and legs, ruling out demyelinating diseases including MS  Please change the order please alert referral team He has the MRI scheduled on the fifth so this needs to be done today please

## 2023-10-28 NOTE — Telephone Encounter (Signed)
 Copied from CRM #[id number]. Topic: Clinical - Request for Lab/Test Order >> Oct 28, 2023  9:10 AM Delon HERO wrote: Reason for CRM: Elisa calling from [facility] [place] is calling to request if orders can be sent to [facility] for MRI for the cerival Spine with out constrast & MRI Brain without constrast  Fax Number [facility]- [phone] Phone [facility]- [phone]  Elisa [employer] [phone]

## 2023-10-28 NOTE — Telephone Encounter (Signed)
 Yes may utilize [facility] It would be necessary to cancel the MRI for the fifth It would also be necessary to call the patient to notify him that his insurance is switching it over to [place] [facility] It is also necessary to try to have [facility] do this test this coming week as a stat test thank you

## 2023-10-30 ENCOUNTER — Ambulatory Visit (HOSPITAL_COMMUNITY)

## 2023-10-31 ENCOUNTER — Other Ambulatory Visit: Payer: Self-pay

## 2023-10-31 DIAGNOSIS — R202 Paresthesia of skin: Secondary | ICD-10-CM

## 2023-10-31 NOTE — Telephone Encounter (Signed)
 New MRI codes are still covered under previous authorization.  If Patient cancelled Appt he may call [facility] Centralized Scheduling at [phone] and reschedule MRI's.

## 2023-11-01 NOTE — Telephone Encounter (Signed)
 Spoke with patient and informed per MRI authorization approval and provided scheduling information and number, advised pt to check with his insurance as far a where it would be best to get have the MRI done.  Pt verbalized understanding .

## 2023-11-02 ENCOUNTER — Ambulatory Visit (HOSPITAL_COMMUNITY): Admission: RE | Admit: 2023-11-02 | Source: Ambulatory Visit

## 2023-11-02 NOTE — Telephone Encounter (Signed)
 Per EPIC: MRI is scheduled for today and patient is aware

## 2023-11-03 ENCOUNTER — Telehealth: Payer: Self-pay | Admitting: Family Medicine

## 2023-11-03 NOTE — Telephone Encounter (Signed)
 Patient scheduled an appointment tomorrow with Dr Bluford ( Dr Glendia is out of the office )

## 2023-11-03 NOTE — Telephone Encounter (Signed)
 Nurses-please talk with Dr. Bluford Please write out the order for MRI of the brain with and without contrast, MRI of the cervical spine with and without contrast Please write these out on a prescription pad Please handle this issue Friday morning Please be aware I am out of the state for Thursday Friday and most of Monday-therefore Dr. Bluford will need to sign these orders Then please fax these in And also send patient a MyChart message  It appears patient also has a appointment with Dr. Bluford (From all the various telephone messages MyChart messages and referral team messages it is quite apparent that this has been a significant journey trying to get his test approved-hopefully all of the above will handle this issue)  Thank you-Dr. Glendia Fielding

## 2023-11-03 NOTE — Telephone Encounter (Signed)
 Patient mother June came into office saying the mri that is scheduled for 10.11.2025 is scheduled at the wrong location needs to be [facility] in [place] per insurance. Contact patient at  [phone]

## 2023-11-04 ENCOUNTER — Ambulatory Visit (INDEPENDENT_AMBULATORY_CARE_PROVIDER_SITE_OTHER): Payer: Self-pay | Admitting: Family Medicine

## 2023-11-04 ENCOUNTER — Encounter: Payer: Self-pay | Admitting: Family Medicine

## 2023-11-04 VITALS — BP 116/73 | HR 93 | Ht 74.0 in | Wt 244.0 lb

## 2023-11-04 DIAGNOSIS — J988 Other specified respiratory disorders: Secondary | ICD-10-CM | POA: Insufficient documentation

## 2023-11-04 HISTORY — DX: Other specified respiratory disorders: J98.8

## 2023-11-04 MED ORDER — DOXYCYCLINE HYCLATE 100 MG PO CAPS: 100.0000 mg | ORAL_CAPSULE | Freq: Two times a day (BID) | ORAL | 0 refills | Status: DC

## 2023-11-04 NOTE — Telephone Encounter (Signed)
 Dr Bluford will write and sign script and give to patient at appointment

## 2023-11-04 NOTE — Assessment & Plan Note (Signed)
 Mostly experiencing upper respiratory symptoms consistent with sinusitis.  Also having some lower respiratory tract symptoms.  Doxycycline  as prescribed.

## 2023-11-04 NOTE — Progress Notes (Signed)
 Subjective:  Patient ID: Jeremy Webster, male    DOB: 11/06/1995  Age: 28 y.o. MRN: [id number]  CC:   Chief Complaint  Patient presents with   Sinusitis    Cough, congestion, pressure in head, green mucus, pain along shoulders and arms     HPI:  28 year old male presents for evaluation of the above.  Patient reports that his symptoms started last "Sunday.  He reports cough, drainage, congestion, some associated shoulder pain and back pain.  He is also has some chest discomfort presumably due to the cough.  No fever.  No relieving factors.  No other complaints at this time.  Patient Active Problem List   Diagnosis Date Noted   Respiratory infection 11/04/2023   Allergic rhinitis 10/25/2023   Tachycardia 10/25/2023   Paresthesia of arm 10/10/2023   Acute bilateral low back pain without sciatica 10/10/2023   Tobacco chew use 10/07/2016    Social Hx   Social History   Socioeconomic History   Marital status: Married    Spouse name: Not on file   Number of children: Not on file   Years of education: Not on file   Highest education level: Not on file  Occupational History   Not on file  Tobacco Use   Smoking status: Never   Smokeless tobacco: Current    Types: Chew  Vaping Use   Vaping status: Never Used  Substance and Sexual Activity   Alcohol use: Yes    Comment: occ   Drug use: No   Sexual activity: Yes    Birth control/protection: None  Other Topics Concern   Not on file  Social History Narrative   Not on file   Social Drivers of Health   Financial Resource Strain: Not on file  Food Insecurity: Not on file  Transportation Needs: Not on file  Physical Activity: Not on file  Stress: Not on file  Social Connections: Not on file    Review of Systems Per HPI  Objective:  BP 116/73   Pulse 93   Ht 6' 2 (1.88 m)   Wt 244 lb (110.7 kg)   SpO2 98%   BMI 31.33 kg/m      10" /10/2023    3:27 PM 10/27/2023   12:29 PM 10/27/2023    9:18 AM  BP/Weight   Systolic BP 116 111 120  Diastolic BP 73 74 77  Wt. (Lbs) 244    BMI 31.33 kg/m2      Physical Exam Vitals and nursing note reviewed.  Constitutional:      General: He is not in acute distress.    Appearance: Normal appearance.  HENT:     Head: Normocephalic and atraumatic.     Right Ear: Tympanic membrane normal.     Left Ear: Tympanic membrane normal.     Mouth/Throat:     Pharynx: Posterior oropharyngeal erythema present.  Eyes:     General:        Right eye: No discharge.        Left eye: No discharge.     Conjunctiva/sclera: Conjunctivae normal.  Cardiovascular:     Rate and Rhythm: Normal rate and regular rhythm.  Pulmonary:     Effort: Pulmonary effort is normal.     Breath sounds: Normal breath sounds. No wheezing, rhonchi or rales.  Neurological:     Mental Status: He is alert.  Psychiatric:        Mood and Affect: Mood normal.  Behavior: Behavior normal.     Lab Results  Component Value Date   WBC 11.0 (H) 10/27/2023   HGB 17.4 (H) 10/27/2023   HCT 51.3 10/27/2023   PLT 333 10/27/2023   GLUCOSE 106 (H) 10/27/2023   ALT 20 10/27/2023   AST 32 10/27/2023   NA 136 10/27/2023   K 5.3 (H) 10/27/2023   CL 99 10/27/2023   CREATININE 1.00 10/27/2023   BUN 13 10/27/2023   CO2 24 10/27/2023     Assessment & Plan:  Respiratory infection Assessment & Plan: Mostly experiencing upper respiratory symptoms consistent with sinusitis.  Also having some lower respiratory tract symptoms.  Doxycycline  as prescribed.   Other orders -     Doxycycline  Hyclate; Take 1 capsule (100 mg total) by mouth 2 (two) times daily. Take with food.  Dispense: 14 capsule; Refill: 0    Follow-up:  Return if symptoms worsen or fail to improve.  Jacqulyn Ahle DO [facility]

## 2023-11-05 ENCOUNTER — Ambulatory Visit (HOSPITAL_COMMUNITY)

## 2023-11-06 DIAGNOSIS — Z792 Long term (current) use of antibiotics: Secondary | ICD-10-CM | POA: Diagnosis not present

## 2023-11-06 DIAGNOSIS — R6884 Jaw pain: Secondary | ICD-10-CM | POA: Diagnosis not present

## 2023-11-06 DIAGNOSIS — R42 Dizziness and giddiness: Secondary | ICD-10-CM | POA: Diagnosis not present

## 2023-11-06 DIAGNOSIS — R202 Paresthesia of skin: Secondary | ICD-10-CM | POA: Diagnosis not present

## 2023-11-06 DIAGNOSIS — M542 Cervicalgia: Secondary | ICD-10-CM | POA: Diagnosis not present

## 2023-11-06 DIAGNOSIS — R0789 Other chest pain: Secondary | ICD-10-CM | POA: Diagnosis not present

## 2023-11-06 DIAGNOSIS — R0782 Intercostal pain: Secondary | ICD-10-CM | POA: Diagnosis not present

## 2023-11-07 ENCOUNTER — Encounter: Payer: Self-pay | Admitting: Family Medicine

## 2023-11-07 ENCOUNTER — Other Ambulatory Visit: Payer: Self-pay | Admitting: Family Medicine

## 2023-11-07 DIAGNOSIS — R079 Chest pain, unspecified: Secondary | ICD-10-CM | POA: Diagnosis not present

## 2023-11-07 DIAGNOSIS — R001 Bradycardia, unspecified: Secondary | ICD-10-CM | POA: Diagnosis not present

## 2023-11-07 MED ORDER — AZITHROMYCIN 250 MG PO TABS: ORAL_TABLET | ORAL | 0 refills | Status: AC

## 2023-11-07 NOTE — Telephone Encounter (Signed)
 Cook, Jayce G, DO      11/07/23  3:04 PM Stop Doxy. New antibiotic sent in.

## 2023-11-08 ENCOUNTER — Encounter: Payer: Self-pay | Admitting: Family Medicine

## 2023-11-08 ENCOUNTER — Other Ambulatory Visit: Payer: Self-pay | Admitting: Family Medicine

## 2023-11-08 MED ORDER — PANTOPRAZOLE SODIUM 40 MG PO TBEC: 40.0000 mg | DELAYED_RELEASE_TABLET | Freq: Every day | ORAL | 0 refills | Status: DC

## 2023-11-08 MED ORDER — SUCRALFATE 1 G PO TABS: ORAL_TABLET | ORAL | 0 refills | Status: DC

## 2023-11-08 NOTE — Telephone Encounter (Signed)
Patient aware see other my chart message

## 2023-11-10 ENCOUNTER — Ambulatory Visit (INDEPENDENT_AMBULATORY_CARE_PROVIDER_SITE_OTHER): Admitting: Family Medicine

## 2023-11-10 VITALS — BP 108/70 | HR 62 | Temp 98.8°F | Ht 74.0 in | Wt 241.0 lb

## 2023-11-10 DIAGNOSIS — R202 Paresthesia of skin: Secondary | ICD-10-CM | POA: Diagnosis not present

## 2023-11-10 DIAGNOSIS — R197 Diarrhea, unspecified: Secondary | ICD-10-CM

## 2023-11-10 DIAGNOSIS — R109 Unspecified abdominal pain: Secondary | ICD-10-CM

## 2023-11-10 NOTE — Progress Notes (Signed)
   Subjective:    Patient ID: Jeremy Webster, male    DOB: 05-11-1995, 28 y.o.   MRN: [id number]  HPI Pt. Room 5  Pt. Has addressed to have stomach pain, nauseous, and tingling throughout the body.  Discussed the use of AI scribe software for clinical note transcription with the patient, who gave verbal consent to proceed.  History of Present Illness   Jeremy Webster is a 28 year old male who presents with nausea, abdominal pain, and weight loss.  He has been experiencing nausea and a significant decrease in appetite for the past couple of weeks. Abdominal pain is present, particularly in the lower abdomen and more pronounced on the right side, with radiation to the lower back. The pain is intermittent. He also notes occasional aches in his arm and elbow. No fever is present, but he mentions episodes of facial flushing, particularly noted yesterday. Bowel movements have been inconsistent, initially loose before starting antibiotics, and more recently, he experienced constipation with difficulty passing stool this morning. No issues with urination are reported.  He has been experiencing numbness from the waist down, affecting both legs and occasionally his arms. Soreness in the chest and shoulders is present, though not exacerbated by pressure. No sore throat or difficulty swallowing is reported, and liquids are going down without issue.  He has not yet started the azithromycin  prescribed to him. He previously took doxycycline , which alleviated respiratory symptoms but increased nausea.       Review of Systems     Objective:   Physical Exam General-in no acute distress Eyes-no discharge Lungs-respiratory rate normal, CTA CV-no murmurs,RRR Extremities skin warm dry no edema Neuro grossly normal Behavior normal, alert  abdomen is soft no guarding rebound or tenderness       Assessment & Plan:  1. Abdominal discomfort Lab ordered await results I find no evidence of appendicitis I  think possibly some of this is related to the doxycycline  he was recently  Certainly if he gets worse to follow-up sooner - Lipase - CBC with Differential - Comprehensive Metabolic Panel (CMET) - Alpha-Gal Panel  2. Diarrhea, unspecified type (Primary) Related to recent antibiotics - Lipase - CBC with Differential - Comprehensive Metabolic Panel (CMET) - Alpha-Gal Panel  3. Paresthesia of arm Patient has MRI scheduled at the end of the month of the brain and the neck  Certainly we are taking seriously his complaints but there is also a possibility that there could be some underlying anxiety that has been provoked by these physical symptoms making some of these physical symptoms worse

## 2023-11-11 ENCOUNTER — Ambulatory Visit: Payer: Self-pay | Admitting: Family Medicine

## 2023-11-14 LAB — COMPREHENSIVE METABOLIC PANEL WITH GFR
ALT: 14 IU/L (ref 0–44)
AST: 14 IU/L (ref 0–40)
Albumin: 4.9 g/dL (ref 4.3–5.2)
Alkaline Phosphatase: 65 IU/L (ref 47–123)
BUN/Creatinine Ratio: 9 (ref 9–20)
BUN: 8 mg/dL (ref 6–20)
Bilirubin Total: 0.9 mg/dL (ref 0.0–1.2)
CO2: 23 mmol/L (ref 20–29)
Calcium: 9.9 mg/dL (ref 8.7–10.2)
Chloride: 100 mmol/L (ref 96–106)
Creatinine, Ser: 0.94 mg/dL (ref 0.76–1.27)
Globulin, Total: 2.8 g/dL (ref 1.5–4.5)
Glucose: 103 mg/dL — ABNORMAL HIGH (ref 70–99)
Potassium: 4.6 mmol/L (ref 3.5–5.2)
Sodium: 140 mmol/L (ref 134–144)
Total Protein: 7.7 g/dL (ref 6.0–8.5)
eGFR: 115 mL/min/1.73 (ref >59)

## 2023-11-14 LAB — CBC WITH DIFFERENTIAL/PLATELET
Basophils Absolute: 0 x10E3/uL (ref 0.0–0.2)
Basos: 1 %
EOS (ABSOLUTE): 0.1 x10E3/uL (ref 0.0–0.4)
Eos: 2 %
Hematocrit: 48.7 % (ref 37.5–51.0)
Hemoglobin: 15.7 g/dL (ref 13.0–17.7)
Immature Grans (Abs): 0 x10E3/uL (ref 0.0–0.1)
Immature Granulocytes: 0 %
Lymphocytes Absolute: 1.6 x10E3/uL (ref 0.7–3.1)
Lymphs: 27 %
MCH: 27.9 pg (ref 26.6–33.0)
MCHC: 32.2 g/dL (ref 31.5–35.7)
MCV: 87 fL (ref 79–97)
Monocytes Absolute: 0.5 x10E3/uL (ref 0.1–0.9)
Monocytes: 8 %
Neutrophils Absolute: 3.8 x10E3/uL (ref 1.4–7.0)
Neutrophils: 62 %
Platelets: 372 x10E3/uL (ref 150–450)
RBC: 5.63 x10E6/uL (ref 4.14–5.80)
RDW: 12.2 % (ref 11.6–15.4)
WBC: 6.1 x10E3/uL (ref 3.4–10.8)

## 2023-11-14 LAB — LIPASE: Lipase: 22 U/L (ref 13–78)

## 2023-11-14 LAB — ALPHA-GAL PANEL
Allergen Lamb IgE: <0.1 kU/L
Beef IgE: <0.1 kU/L
IgE (Immunoglobulin E), Serum: 25 [IU]/mL (ref 6–495)
O215-IgE Alpha-Gal: <0.1 kU/L
Pork IgE: <0.1 kU/L

## 2023-11-22 DIAGNOSIS — R202 Paresthesia of skin: Secondary | ICD-10-CM | POA: Diagnosis not present

## 2023-11-22 DIAGNOSIS — M79609 Pain in unspecified limb: Secondary | ICD-10-CM | POA: Diagnosis not present

## 2023-11-22 DIAGNOSIS — R200 Anesthesia of skin: Secondary | ICD-10-CM | POA: Diagnosis not present

## 2023-11-22 DIAGNOSIS — Z135 Encounter for screening for eye and ear disorders: Secondary | ICD-10-CM | POA: Diagnosis not present

## 2023-11-28 ENCOUNTER — Telehealth: Payer: Self-pay | Admitting: Family Medicine

## 2023-11-28 ENCOUNTER — Encounter: Payer: Self-pay | Admitting: Family Medicine

## 2023-11-28 NOTE — Telephone Encounter (Signed)
 Nurses I received a copy of his MRI of the cervical spine but I did not receive a copy of the MRI of the brain Please connect with [facility] believe he had those they are a few days ago thank you  I need a copy of the MRI of the brain thank you

## 2023-11-28 NOTE — Telephone Encounter (Signed)
 Please set up consultation with neurology due to numbness in arms and legs with a negative MRI of the brain and cervical spine thank you  Patient is aware proceed forward with the referral

## 2023-11-29 ENCOUNTER — Other Ambulatory Visit: Payer: Self-pay

## 2023-11-29 DIAGNOSIS — R200 Anesthesia of skin: Secondary | ICD-10-CM

## 2023-11-29 NOTE — Telephone Encounter (Signed)
 See doctor message to pt in chart

## 2023-11-30 NOTE — Telephone Encounter (Signed)
 I think it would be best for him to be seen It is hard to know if this is something serious in regards to needing to be seen right away versus in the next several days Please reach out to patient I think starting off without appointment within the next 48 hours would be a good idea if his symptoms are severe I would recommend going to the ER unfortunately chest discomforts are extremely difficult to judge over electronics/MyChart messages-therefore needs a in person visit

## 2023-12-06 ENCOUNTER — Encounter: Payer: Self-pay | Admitting: Family Medicine

## 2023-12-15 ENCOUNTER — Emergency Department (HOSPITAL_COMMUNITY)
Admission: EM | Admit: 2023-12-15 | Discharge: 2023-12-15 | Disposition: A | Discharge status: Home / Self Care | Attending: Emergency Medicine | Admitting: Emergency Medicine

## 2023-12-15 ENCOUNTER — Encounter (HOSPITAL_COMMUNITY): Payer: Self-pay

## 2023-12-15 ENCOUNTER — Encounter: Payer: Self-pay | Admitting: Family Medicine

## 2023-12-15 ENCOUNTER — Other Ambulatory Visit: Payer: Self-pay

## 2023-12-15 DIAGNOSIS — R202 Paresthesia of skin: Secondary | ICD-10-CM | POA: Diagnosis not present

## 2023-12-15 DIAGNOSIS — R0789 Other chest pain: Secondary | ICD-10-CM | POA: Diagnosis not present

## 2023-12-15 DIAGNOSIS — R200 Anesthesia of skin: Secondary | ICD-10-CM

## 2023-12-15 DIAGNOSIS — R079 Chest pain, unspecified: Secondary | ICD-10-CM | POA: Diagnosis not present

## 2023-12-15 NOTE — Telephone Encounter (Signed)
 Spoke with patient and advised him of provider's recommendations. Patient wanted to schedule follow up office visit with Dr Bluford on Monday- appointment scheduled

## 2023-12-15 NOTE — ED Triage Notes (Signed)
 Pt arrived to ED c/o left arm tingling and numbness along with central chest pain that has been on going for several months. States he has been seen for same before but didn't receive a formal diagnosis.

## 2023-12-15 NOTE — Telephone Encounter (Signed)
 Nurses-please go ahead with cardiology consultation due to reoccurring chest pain with left arm numbness thank you  Please send Jeremy Webster MyChart message letting him know this has been initiated Cardiology will reach out to him within the next 10 to 14 days to schedule that appointment thank you

## 2023-12-15 NOTE — Telephone Encounter (Signed)
 Nurses-I am out of the office the rest of the week but Dr. Bluford does have availability on Monday-my schedule next week is full Certainly if patient feels that things are getting worse he should consider going back to the ER it is hard to advise him based on not seeing him.  But if he feels like it is not just more intermittent discomfort he can come see Dr. Bluford early in the week

## 2023-12-15 NOTE — ED Provider Notes (Signed)
 [facility] Provider Note   CSN: [id number] Arrival date & time: 12/15/23  0005     Patient presents with: Numbness   Jeremy Webster is a 28 y.o. male.   Patient is a 28 year old male presenting with complaints of left arm numbness and chest discomfort.  He has been experiencing the symptoms for many months, but more frequently over the past few days.  He has been seen here in the ER on multiple occasions and by his primary doctor.  He has undergone extensive workup including cardiac testing and MRI of his head and neck, all of which have been unremarkable.  He denies any shortness of breath, nausea, or diaphoresis.  He denies any exertional symptoms.  He denies any weakness of the arm.       Prior to Admission medications   Medication Sig Start Date End Date Taking? Authorizing Provider  cetirizine (ZYRTEC) 10 MG tablet Take 1 tablet (10 mg total) by mouth daily. 10/25/23   [facility] Sebastian Pool, NP  fluticasone (FLONASE) 50 MCG/ACT nasal spray Place 2 sprays into both nostrils daily. 10/25/23   [facility] Sebastian Pool, NP  guaifenesin (HUMIBID E) 400 MG TABS tablet Take 1 tablet (400 mg total) by mouth every 6 (six) hours as needed. Patient not taking: Reported on 11/10/2023 10/25/23   Deitra Morton Sebastian Pool, NP  pantoprazole (PROTONIX) 40 MG tablet Take 1 tablet (40 mg total) by mouth daily. 11/08/23   Alphonsa Glendia LABOR, MD  sucralfate (CARAFATE) 1 g tablet Crush 1 tablet then mix in 30 ml of water then use three times daily for 10 days 11/08/23   Alphonsa Glendia LABOR, MD    Allergies: Amoxil [amoxicillin], Bactrim [sulfamethoxazole-trimethoprim], and Cefzil [cefprozil]    Review of Systems  All other systems reviewed and are negative.   Updated Vital Signs BP 127/76 (BP Location: Left Arm)   Pulse 70   Temp 98 F (36.7 C) (Oral)   Resp 16   Ht 6' 2 (1.88 m)   Wt 109.3 kg   SpO2 99%   BMI 30.94 kg/m   Physical Exam Vitals  and nursing note reviewed.  Constitutional:      General: He is not in acute distress.    Appearance: He is well-developed. He is not diaphoretic.  HENT:     Head: Normocephalic and atraumatic.  Cardiovascular:     Rate and Rhythm: Normal rate and regular rhythm.     Heart sounds: No murmur heard.    No friction rub.  Pulmonary:     Effort: Pulmonary effort is normal. No respiratory distress.     Breath sounds: Normal breath sounds. No wheezing or rales.  Abdominal:     General: Bowel sounds are normal. There is no distension.     Palpations: Abdomen is soft.     Tenderness: There is no abdominal tenderness.  Musculoskeletal:        General: Normal range of motion.     Cervical back: Normal range of motion and neck supple.     Comments: The left upper extremity is grossly normal in appearance.  Ulnar and radial pulses are easily palpable and motor and sensation are intact throughout the entire arm and hand.  Skin:    General: Skin is warm and dry.  Neurological:     Mental Status: He is alert and oriented to person, place, and time.     Coordination: Coordination normal.     (  all labs ordered are listed, but only abnormal results are displayed) Labs Reviewed - No data to display  EKG: EKG Interpretation Date/Time:  Thursday December 15 2023 00:26:25 EST Ventricular Rate:  61 PR Interval:  160 QRS Duration:  103 QT Interval:  378 QTC Calculation: 381 R Axis:   46  Text Interpretation: Sinus rhythm Normal ECG Confirmed by Geroldine Berg ([id number]) on 12/15/2023 12:28:48 AM  Radiology: No results found.   Procedures   Medications Ordered in the ED - No data to display                                  Medical Decision Making  Patient presented with complaints of left arm numbness that has been ongoing for several months.  He arrives here with stable vital signs and is afebrile.  He is neurologically intact.  His EKG today is normal.  Given the fact he has already  had multiple cardiac workups and negative MRI of the head and neck, I do not feel as though any emergent studies are indicated at this time.  His primary doctor was to refer him to neurology, however appointments are not available until April.  I will place an outpatient referral to neurology and see if this can speed up the process.  Patient to follow-up with primary doctor.     Final diagnoses:  None    ED Discharge Orders     None          Geroldine Berg, MD 12/15/23 [phone]

## 2023-12-15 NOTE — Discharge Instructions (Signed)
 A referral has been placed to neurology.  They should call you to make arrangements for a follow-up appointment.  Follow-up with primary doctor and return to the ER if symptoms significantly worsen or change.

## 2023-12-19 ENCOUNTER — Encounter: Payer: Self-pay | Admitting: Family Medicine

## 2023-12-19 ENCOUNTER — Ambulatory Visit (INDEPENDENT_AMBULATORY_CARE_PROVIDER_SITE_OTHER): Payer: Self-pay | Admitting: Family Medicine

## 2023-12-19 VITALS — BP 106/67 | HR 83 | Temp 98.2°F | Ht 74.0 in | Wt 238.0 lb

## 2023-12-19 DIAGNOSIS — R202 Paresthesia of skin: Secondary | ICD-10-CM | POA: Diagnosis not present

## 2023-12-19 DIAGNOSIS — R4589 Other symptoms and signs involving emotional state: Secondary | ICD-10-CM

## 2023-12-19 MED ORDER — FLUOXETINE HCL 10 MG PO CAPS: 10.0000 mg | ORAL_CAPSULE | Freq: Every day | ORAL | 3 refills | Status: AC

## 2023-12-19 NOTE — Patient Instructions (Signed)
Medication as directed.  Follow up in 6 weeks.  Take care  Dr. Adriana Simas

## 2023-12-19 NOTE — Assessment & Plan Note (Signed)
 I feel that this is anxiety related.  He has had an extensive workup.  I discussed this with his primary care doctor today.  Starting fluoxetine .

## 2023-12-19 NOTE — Assessment & Plan Note (Signed)
 Uncontrolled.  Starting fluoxetine .

## 2023-12-19 NOTE — Progress Notes (Signed)
 Subjective:  Patient ID: Jeremy Webster, male    DOB: 09-18-1995  Age: 28 y.o. MRN: [id number]  CC:   Chief Complaint  Patient presents with   ER follow up chest pains    Pt reports random chest pains, L shoulder and arm pains that occur randomly daily     HPI:  28 year old male presents for evaluation of the above.  Patient has been evaluated in the ER 5 times in the past 2 months for these complaints.  Reports intermittent left-sided chest pain as well as left arm paresthesias.  He has also had paresthesias of other locations including the face as well as arms and legs.  Has had an extensive workup including labs, CT imaging, and MRI imaging.  Referral to neurology has been placed and his appointment is in April.  Patient states that he often gets quite anxious about his symptoms and then fixates on his symptoms and causes more issues.  Patient Active Problem List   Diagnosis Date Noted   Paresthesias 12/19/2023   Anxiety about health 12/19/2023   Allergic rhinitis 10/25/2023   Tachycardia 10/25/2023   Tobacco chew use 10/07/2016    Social Hx   Social History   Socioeconomic History   Marital status: Married    Spouse name: Not on file   Number of children: Not on file   Years of education: Not on file   Highest education level: Not on file  Occupational History   Not on file  Tobacco Use   Smoking status: Never   Smokeless tobacco: Current    Types: Chew  Vaping Use   Vaping status: Never Used  Substance and Sexual Activity   Alcohol use: Yes    Comment: occ   Drug use: No   Sexual activity: Yes    Birth control/protection: None  Other Topics Concern   Not on file  Social History Narrative   Not on file   Social Drivers of Health   Financial Resource Strain: Not on file  Food Insecurity: Not on file  Transportation Needs: Not on file  Physical Activity: Not on file  Stress: Not on file  Social Connections: Not on file    Review of Systems Per  HPI  Objective:  BP 106/67   Pulse 83   Temp 98.2 F (36.8 C)   Ht 6' 2 (1.88 m)   Wt 238 lb (108 kg)   SpO2 99%   BMI 30.56 kg/m      12/19/2023    8:39 AM 12/15/2023   12:45 AM 12/15/2023   12:30 AM  BP/Weight  Systolic BP 106 132 109  Diastolic BP 67 94 80  Wt. (Lbs) 238    BMI 30.56 kg/m2      Physical Exam  Lab Results  Component Value Date   WBC 6.1 11/10/2023   HGB 15.7 11/10/2023   HCT 48.7 11/10/2023   PLT 372 11/10/2023   GLUCOSE 103 (H) 11/10/2023   ALT 14 11/10/2023   AST 14 11/10/2023   NA 140 11/10/2023   K 4.6 11/10/2023   CL 100 11/10/2023   CREATININE 0.94 11/10/2023   BUN 8 11/10/2023   CO2 23 11/10/2023     Assessment & Plan:  Paresthesias Assessment & Plan: I feel that this is anxiety related.  He has had an extensive workup.  I discussed this with his primary care doctor today.  Starting fluoxetine .   Anxiety about health Assessment & Plan: Uncontrolled.  Starting fluoxetine .  Other orders -     FLUoxetine  HCl; Take 1 capsule (10 mg total) by mouth daily.  Dispense: 90 capsule; Refill: 3    Follow-up: 6-week follow-up with PCP  Jacqulyn Ahle DO [facility]

## 2024-01-16 ENCOUNTER — Ambulatory Visit
Admission: EM | Admit: 2024-01-16 | Discharge: 2024-01-16 | Disposition: A | Discharge status: Home / Self Care | Attending: Family Medicine | Admitting: Family Medicine

## 2024-01-16 DIAGNOSIS — J069 Acute upper respiratory infection, unspecified: Secondary | ICD-10-CM | POA: Diagnosis not present

## 2024-01-16 LAB — POC COVID19/FLU A&B COMBO
Covid Antigen, POC: NEGATIVE
Influenza A Antigen, POC: NEGATIVE
Influenza B Antigen, POC: NEGATIVE

## 2024-01-16 MED ORDER — PROMETHAZINE-DM 6.25-15 MG/5ML PO SYRP: 5.0000 mL | ORAL_SOLUTION | Freq: Four times a day (QID) | ORAL | 0 refills | Status: AC | PRN

## 2024-01-16 MED ORDER — AZELASTINE HCL 0.1 % NA SOLN: 1.0000 | Freq: Two times a day (BID) | NASAL | 0 refills | Status: AC

## 2024-01-16 NOTE — ED Provider Notes (Signed)
 " [facility]    CSN: [id number] Arrival date & time: 01/16/24  1004      History   Chief Complaint No chief complaint on file.   HPI Jeremy Webster is a 28 y.o. male.   Presenting today with 3-day history of congestion, sore throat, cough, headache, fatigue.  Denies fever, chest pain, shortness of breath, abdominal pain, vomiting, diarrhea.  So far trying Alka-Seltzer cold and flu with mild temporary benefit.  No known history of chronic pulmonary disease.    Past Medical History:  Diagnosis Date   Acute bilateral low back pain without sciatica 10/10/2023   Allergic rhinitis 10/25/2023   Paresthesia of arm 10/10/2023   Respiratory infection 11/04/2023   Tachycardia 10/25/2023   Tobacco chew use 10/07/2016    Patient Active Problem List   Diagnosis Date Noted   Paresthesias 12/19/2023   Anxiety about health 12/19/2023   Allergic rhinitis 10/25/2023   Tachycardia 10/25/2023   Tobacco chew use 10/07/2016    Past Surgical History:  Procedure Laterality Date   ADENOIDECTOMY     TONSILLECTOMY AND ADENOIDECTOMY  08/27/04       Home Medications    Prior to Admission medications  Medication Sig Start Date End Date Taking? Authorizing Provider  azelastine  (ASTELIN ) 0.1 % nasal spray Place 1 spray into both nostrils 2 (two) times daily. Use in each nostril as directed 01/16/24  Yes Stuart Vernell Norris, PA-C  promethazine -dextromethorphan (PROMETHAZINE -DM) 6.25-15 MG/5ML syrup Take 5 mLs by mouth 4 (four) times daily as needed. 01/16/24  Yes Stuart Vernell Norris, PA-C  cetirizine  (ZYRTEC ) 10 MG tablet Take 1 tablet (10 mg total) by mouth daily. 10/25/23   St Morton Sebastian Pool, NP  FLUoxetine  (PROZAC ) 10 MG capsule Take 1 capsule (10 mg total) by mouth daily. 12/19/23   Cook, Jayce G, DO  fluticasone  (FLONASE ) 50 MCG/ACT nasal spray Place 2 sprays into both nostrils daily. 10/25/23   St Morton Sebastian Pool, NP    Family History History reviewed. No  pertinent family history.  Social History Social History[1]   Allergies   Amoxil [amoxicillin], Bactrim [sulfamethoxazole-trimethoprim], and Cefzil [cefprozil]   Review of Systems Review of Systems Per HPI  Physical Exam Triage Vital Signs ED Triage Vitals  Encounter Vitals Group     BP 01/16/24 1125 120/77     Girls Systolic BP Percentile --      Girls Diastolic BP Percentile --      Boys Systolic BP Percentile --      Boys Diastolic BP Percentile --      Pulse Rate 01/16/24 1125 75     Resp 01/16/24 1125 20     Temp 01/16/24 1125 98.5 F (36.9 C)     Temp Source 01/16/24 1125 Oral     SpO2 01/16/24 1125 97 %     Weight --      Height --      Head Circumference --      Peak Flow --      Pain Score 01/16/24 1128 0     Pain Loc --      Pain Education --      Exclude from Growth Chart --    No data found.  Updated Vital Signs BP 120/77 (BP Location: Right Arm)   Pulse 75   Temp 98.5 F (36.9 C) (Oral)   Resp 20   SpO2 97%   Visual Acuity Right Eye Distance:   Left Eye Distance:   Bilateral Distance:  Right Eye Near:   Left Eye Near:    Bilateral Near:     Physical Exam Vitals and nursing note reviewed.  Constitutional:      Appearance: He is well-developed.  HENT:     Head: Atraumatic.     Right Ear: External ear normal.     Left Ear: External ear normal.     Nose: Rhinorrhea present.     Mouth/Throat:     Pharynx: Posterior oropharyngeal erythema present. No oropharyngeal exudate.  Eyes:     Conjunctiva/sclera: Conjunctivae normal.     Pupils: Pupils are equal, round, and reactive to light.  Cardiovascular:     Rate and Rhythm: Normal rate and regular rhythm.  Pulmonary:     Effort: Pulmonary effort is normal. No respiratory distress.     Breath sounds: No wheezing or rales.  Musculoskeletal:        General: Normal range of motion.     Cervical back: Normal range of motion and neck supple.  Lymphadenopathy:     Cervical: No cervical  adenopathy.  Skin:    General: Skin is warm and dry.  Neurological:     Mental Status: He is alert and oriented to person, place, and time.  Psychiatric:        Behavior: Behavior normal.      UC Treatments / Results  Labs (all labs ordered are listed, but only abnormal results are displayed) Labs Reviewed  POC COVID19/FLU A&B COMBO    EKG   Radiology No results found.  Procedures Procedures (including critical care time)  Medications Ordered in UC Medications - No data to display  Initial Impression / Assessment and Plan / UC Course  I have reviewed the triage vital signs and the nursing notes.  Pertinent labs & imaging results that were available during my care of the patient were reviewed by me and considered in my medical decision making (see chart for details).     Vitals and exam reassuring today, rapid COVID-negative.  Will treat for viral respiratory infection with Phenergan  DM, Tessalon , supportive over-the-counter medications and home care.  Work note given.  Return for worsening or unresolving symptoms.  Final Clinical Impressions(s) / UC Diagnoses   Final diagnoses:  Viral URI with cough   Discharge Instructions   None    ED Prescriptions     Medication Sig Dispense Auth. Provider   promethazine -dextromethorphan (PROMETHAZINE -DM) 6.25-15 MG/5ML syrup Take 5 mLs by mouth 4 (four) times daily as needed. 100 mL Stuart Vernell Norris, PA-C   azelastine  (ASTELIN ) 0.1 % nasal spray Place 1 spray into both nostrils 2 (two) times daily. Use in each nostril as directed 30 mL Stuart Vernell Norris, PA-C      PDMP not reviewed this encounter.    [1]  Social History Tobacco Use   Smoking status: Never   Smokeless tobacco: Current    Types: Chew  Vaping Use   Vaping status: Never Used  Substance Use Topics   Alcohol use: Yes    Comment: occ   Drug use: No     Stuart Vernell Norris, PA-C 01/16/24 1228  "

## 2024-01-16 NOTE — ED Triage Notes (Signed)
 Pt reports sinus drainage sore throat and cough headache, denies fever x 3 days

## 2024-02-01 ENCOUNTER — Ambulatory Visit: Admitting: Family Medicine

## 2024-02-01 VITALS — BP 117/81 | HR 90 | Temp 98.2°F | Ht 74.0 in | Wt 246.2 lb

## 2024-02-01 DIAGNOSIS — R4589 Other symptoms and signs involving emotional state: Secondary | ICD-10-CM | POA: Diagnosis not present

## 2024-02-01 DIAGNOSIS — R079 Chest pain, unspecified: Secondary | ICD-10-CM | POA: Diagnosis not present

## 2024-02-01 DIAGNOSIS — R202 Paresthesia of skin: Secondary | ICD-10-CM | POA: Diagnosis not present

## 2024-02-01 DIAGNOSIS — R200 Anesthesia of skin: Secondary | ICD-10-CM | POA: Diagnosis not present

## 2024-02-01 MED ORDER — BUSPIRONE HCL 5 MG PO TABS: ORAL_TABLET | ORAL | 3 refills | Status: AC

## 2024-02-01 NOTE — Progress Notes (Addendum)
" ° °  Subjective:    Patient ID: Jeremy Webster, male    DOB: [birth year], 29 y.o.   MRN: [id number]  HPI Patient with intermittent tingling in his left arm intermittent funny feeling in the left side of the chest No specific angina symptoms Has had workup of neurologic issues has seen different specialists Patient does relate some level of anxiousness regarding his symptoms but denies being overly anxious states he does not feel he needs to take Prozac .  He would like to try different medicine with less potential side effects Patient denies being depressed  Review of Systems     Objective:   Physical Exam General-in no acute distress Eyes-no discharge Lungs-respiratory rate normal, CTA CV-no murmurs,RRR Extremities skin warm dry no edema Neuro grossly normal Behavior normal, alert        Assessment & Plan:  Anxiety related to health issues-hold off on Prozac  BuSpar  twice daily would be reasonable patient to give us  feedback in the next 2 weeks on how that is doing  Intermittent nonspecific chest symptoms reasonable to go ahead and get consultation with cardiology patient already has appointment with Dr. Alvan coming up  Intermittent nonspecific tingling symptoms-patient has appointment with neurology.  Certainly will be helpful to have a specialist opinion on board to make sure we are not overlooking something  Patient to follow-up within 6 months  "

## 2024-02-09 ENCOUNTER — Ambulatory Visit: Admitting: Cardiology

## 2024-04-30 ENCOUNTER — Ambulatory Visit: Admitting: Diagnostic Neuroimaging

## 2024-08-02 ENCOUNTER — Ambulatory Visit: Admitting: Family Medicine
